# Patient Record
Sex: Female | Born: 1945 | Race: White | Hispanic: No | Marital: Married | Smoking: Former smoker
Health system: Southern US, Community
[De-identification: ages and names within clinical notes are randomized; demographics above are authoritative.]

## PROBLEM LIST (undated history)

## (undated) DIAGNOSIS — K219 Gastro-esophageal reflux disease without esophagitis: Secondary | ICD-10-CM

## (undated) DIAGNOSIS — J45909 Unspecified asthma, uncomplicated: Secondary | ICD-10-CM

## (undated) DIAGNOSIS — R110 Nausea: Secondary | ICD-10-CM

## (undated) DIAGNOSIS — IMO0002 Reserved for concepts with insufficient information to code with codable children: Secondary | ICD-10-CM

## (undated) DIAGNOSIS — C4490 Unspecified malignant neoplasm of skin, unspecified: Secondary | ICD-10-CM

## (undated) HISTORY — DX: Unspecified asthma, uncomplicated: J45.909

## (undated) HISTORY — DX: Unspecified malignant neoplasm of skin, unspecified: C44.90

## (undated) HISTORY — PX: BACK SURGERY: SHX140

## (undated) HISTORY — PX: ABDOMINAL HYSTERECTOMY: SHX81

## (undated) HISTORY — DX: Gastro-esophageal reflux disease without esophagitis: K21.9

## (undated) HISTORY — DX: Reserved for concepts with insufficient information to code with codable children: IMO0002

## (undated) HISTORY — PX: OTHER SURGICAL HISTORY: SHX169

---

## 1998-05-29 ENCOUNTER — Other Ambulatory Visit: Admission: RE | Admit: 1998-05-29 | Discharge: 1998-05-29 | Payer: Self-pay | Admitting: *Deleted

## 2001-03-23 ENCOUNTER — Encounter (INDEPENDENT_AMBULATORY_CARE_PROVIDER_SITE_OTHER): Payer: Self-pay

## 2001-03-23 ENCOUNTER — Other Ambulatory Visit: Admission: RE | Admit: 2001-03-23 | Discharge: 2001-03-23 | Payer: Self-pay | Admitting: *Deleted

## 2001-08-29 ENCOUNTER — Other Ambulatory Visit: Admission: RE | Admit: 2001-08-29 | Discharge: 2001-08-29 | Payer: Self-pay | Admitting: *Deleted

## 2010-04-11 ENCOUNTER — Encounter: Admission: RE | Admit: 2010-04-11 | Discharge: 2010-04-11 | Payer: Self-pay | Admitting: Radiology

## 2010-05-13 ENCOUNTER — Encounter: Admission: RE | Admit: 2010-05-13 | Discharge: 2010-05-13 | Payer: Self-pay | Admitting: Radiology

## 2010-07-03 ENCOUNTER — Encounter: Admission: RE | Admit: 2010-07-03 | Discharge: 2010-07-03 | Payer: Self-pay | Admitting: Radiology

## 2012-02-19 ENCOUNTER — Other Ambulatory Visit: Payer: Self-pay | Admitting: *Deleted

## 2012-02-19 DIAGNOSIS — R928 Other abnormal and inconclusive findings on diagnostic imaging of breast: Secondary | ICD-10-CM

## 2012-02-19 DIAGNOSIS — Z803 Family history of malignant neoplasm of breast: Secondary | ICD-10-CM

## 2012-02-19 DIAGNOSIS — N6019 Diffuse cystic mastopathy of unspecified breast: Secondary | ICD-10-CM

## 2012-02-19 DIAGNOSIS — R922 Inconclusive mammogram: Secondary | ICD-10-CM

## 2012-02-19 DIAGNOSIS — N6009 Solitary cyst of unspecified breast: Secondary | ICD-10-CM

## 2012-02-26 ENCOUNTER — Ambulatory Visit
Admission: RE | Admit: 2012-02-26 | Discharge: 2012-02-26 | Disposition: A | Discharge status: Home / Self Care | Payer: Commercial Managed Care - PPO | Source: Ambulatory Visit | Attending: *Deleted | Admitting: *Deleted

## 2012-02-26 DIAGNOSIS — Z803 Family history of malignant neoplasm of breast: Secondary | ICD-10-CM

## 2012-02-26 DIAGNOSIS — R922 Inconclusive mammogram: Secondary | ICD-10-CM

## 2012-02-26 DIAGNOSIS — R928 Other abnormal and inconclusive findings on diagnostic imaging of breast: Secondary | ICD-10-CM

## 2012-02-26 DIAGNOSIS — N6019 Diffuse cystic mastopathy of unspecified breast: Secondary | ICD-10-CM

## 2012-02-26 DIAGNOSIS — N6009 Solitary cyst of unspecified breast: Secondary | ICD-10-CM

## 2012-03-09 ENCOUNTER — Ambulatory Visit
Admission: RE | Admit: 2012-03-09 | Discharge: 2012-03-09 | Disposition: A | Discharge status: Home / Self Care | Payer: Commercial Managed Care - PPO | Source: Ambulatory Visit | Attending: *Deleted | Admitting: *Deleted

## 2012-03-09 DIAGNOSIS — Z803 Family history of malignant neoplasm of breast: Secondary | ICD-10-CM

## 2012-03-09 DIAGNOSIS — N6009 Solitary cyst of unspecified breast: Secondary | ICD-10-CM

## 2012-03-09 DIAGNOSIS — N6019 Diffuse cystic mastopathy of unspecified breast: Secondary | ICD-10-CM

## 2012-03-09 MED ORDER — GADOBENATE DIMEGLUMINE 529 MG/ML IV SOLN
10.0000 mL | Freq: Once | INTRAVENOUS | Status: AC | PRN
  Administered 2012-03-09: 10 mL via INTRAVENOUS

## 2012-04-18 ENCOUNTER — Other Ambulatory Visit: Payer: Self-pay | Admitting: *Deleted

## 2012-04-18 DIAGNOSIS — R918 Other nonspecific abnormal finding of lung field: Secondary | ICD-10-CM

## 2012-04-21 ENCOUNTER — Other Ambulatory Visit: Payer: Commercial Managed Care - PPO

## 2012-04-28 ENCOUNTER — Ambulatory Visit
Admission: RE | Admit: 2012-04-28 | Discharge: 2012-04-28 | Disposition: A | Discharge status: Home / Self Care | Payer: Commercial Managed Care - PPO | Source: Ambulatory Visit | Attending: *Deleted | Admitting: *Deleted

## 2012-04-28 DIAGNOSIS — R918 Other nonspecific abnormal finding of lung field: Secondary | ICD-10-CM

## 2012-09-07 ENCOUNTER — Other Ambulatory Visit: Payer: Self-pay | Admitting: Internal Medicine

## 2012-09-07 DIAGNOSIS — R911 Solitary pulmonary nodule: Secondary | ICD-10-CM

## 2012-09-09 ENCOUNTER — Other Ambulatory Visit: Payer: Commercial Managed Care - PPO

## 2012-09-23 ENCOUNTER — Ambulatory Visit
Admission: RE | Admit: 2012-09-23 | Discharge: 2012-09-23 | Disposition: A | Discharge status: Home / Self Care | Payer: Commercial Managed Care - PPO | Source: Ambulatory Visit | Attending: Internal Medicine | Admitting: Internal Medicine

## 2012-09-23 DIAGNOSIS — R911 Solitary pulmonary nodule: Secondary | ICD-10-CM

## 2014-01-30 ENCOUNTER — Encounter: Payer: Self-pay | Admitting: Gastroenterology

## 2014-02-08 ENCOUNTER — Ambulatory Visit (INDEPENDENT_AMBULATORY_CARE_PROVIDER_SITE_OTHER): Payer: Commercial Managed Care - PPO | Admitting: Otolaryngology

## 2014-03-21 ENCOUNTER — Encounter: Payer: Self-pay | Admitting: Gastroenterology

## 2014-03-21 ENCOUNTER — Other Ambulatory Visit (INDEPENDENT_AMBULATORY_CARE_PROVIDER_SITE_OTHER): Payer: Commercial Managed Care - PPO

## 2014-03-21 ENCOUNTER — Telehealth: Payer: Self-pay | Admitting: Gastroenterology

## 2014-03-21 ENCOUNTER — Ambulatory Visit (INDEPENDENT_AMBULATORY_CARE_PROVIDER_SITE_OTHER): Payer: Commercial Managed Care - PPO | Admitting: Gastroenterology

## 2014-03-21 VITALS — BP 110/60 | HR 74 | Ht 66.0 in | Wt 126.0 lb

## 2014-03-21 DIAGNOSIS — R1013 Epigastric pain: Secondary | ICD-10-CM

## 2014-03-21 LAB — CBC WITH DIFFERENTIAL/PLATELET
BASOS PCT: 1.2 % (ref 0.0–3.0)
Basophils Absolute: 0.1 10*3/uL (ref 0.0–0.1)
EOS PCT: 3.6 % (ref 0.0–5.0)
Eosinophils Absolute: 0.3 10*3/uL (ref 0.0–0.7)
HEMATOCRIT: 37.9 % (ref 36.0–46.0)
HEMOGLOBIN: 12.8 g/dL (ref 12.0–15.0)
LYMPHS PCT: 16.4 % (ref 12.0–46.0)
Lymphs Abs: 1.3 10*3/uL (ref 0.7–4.0)
MCHC: 33.9 g/dL (ref 30.0–36.0)
MCV: 88.3 fl (ref 78.0–100.0)
MONO ABS: 0.9 10*3/uL (ref 0.1–1.0)
Monocytes Relative: 10.6 % (ref 3.0–12.0)
NEUTROS ABS: 5.5 10*3/uL (ref 1.4–7.7)
Neutrophils Relative %: 68.2 % (ref 43.0–77.0)
PLATELETS: 266 10*3/uL (ref 150.0–400.0)
RBC: 4.29 Mil/uL (ref 3.87–5.11)
RDW: 12.9 % (ref 11.5–15.5)
WBC: 8.1 10*3/uL (ref 4.0–10.5)

## 2014-03-21 LAB — COMPREHENSIVE METABOLIC PANEL
ALBUMIN: 4.1 g/dL (ref 3.5–5.2)
ALK PHOS: 416 U/L — AB (ref 39–117)
ALT: 253 U/L — ABNORMAL HIGH (ref 0–35)
AST: 187 U/L — ABNORMAL HIGH (ref 0–37)
BUN: 9 mg/dL (ref 6–23)
CALCIUM: 9.6 mg/dL (ref 8.4–10.5)
CO2: 27 meq/L (ref 19–32)
Chloride: 102 mEq/L (ref 96–112)
Creatinine, Ser: 0.6 mg/dL (ref 0.4–1.2)
GFR: 97.51 mL/min (ref >60.00)
Glucose, Bld: 95 mg/dL (ref 70–99)
Potassium: 3.8 mEq/L (ref 3.5–5.1)
Sodium: 137 mEq/L (ref 135–145)
TOTAL PROTEIN: 7 g/dL (ref 6.0–8.3)
Total Bilirubin: 4.7 mg/dL — ABNORMAL HIGH (ref 0.2–1.2)

## 2014-03-21 NOTE — Patient Instructions (Addendum)
You will be set up for a CT scan of abdomen and pelvis with IV and oral contrast (for epigastric pain)  You have been scheduled for a CT scan of the abdomen and pelvis at [facility] ([address]---this is in the same building as Press [facility]).   You are scheduled on 03/22/14 at 2 PM. You should arrive 15 minutes prior to your appointment time for registration. Please follow the written instructions below on the day of your exam:  WARNING: IF YOU ARE ALLERGIC TO IODINE/X-RAY DYE, PLEASE NOTIFY RADIOLOGY IMMEDIATELY AT [phone]! YOU WILL BE GIVEN A 13 HOUR PREMEDICATION PREP.  1) Do not eat or drink anything after 10 AM (4 hours prior to your test) 2) You have been given 2 bottles of oral contrast to drink. The solution may taste  better if refrigerated, but do NOT add ice or any other liquid to this solution. Shake well before drinking.    Drink 1 bottle of contrast @ 12 PM (2 hours prior to your exam)  Drink 1 bottle of contrast @ 1 PM  (1 hour prior to your exam)  You may take any medications as prescribed with a small amount of water except for the following: Metformin, Glucophage, Glucovance, Avandamet, Riomet, Fortamet, Actoplus Met, Janumet, Glumetza or Metaglip. The above medications must be held the day of the exam AND 48 hours after the exam.  The purpose of you drinking the oral contrast is to aid in the visualization of your intestinal tract. The contrast solution may cause some diarrhea. Before your exam is started, you will be given a small amount of fluid to drink. Depending on your individual set of symptoms, you may also receive an intravenous injection of x-ray contrast/dye. Plan on being at [facility] for 30 minutes or long, depending on the type of exam you are having performed.  This test typically takes 30-45 minutes to complete.  If you have any questions regarding your exam or if you need to reschedule, you may call the CT department at  [phone] between the hours of 8:00 am and 5:00 pm, Monday-Friday.  ________________________________________________________________________  Dennis Bast will have labs checked today in the basement lab.  Please head down after you check out with the front desk  ( cbc, cmet) Pending these results, you may need EGD (would do screening colonoscopy at the same time). Start one gas ex prior to every meal.

## 2014-03-21 NOTE — Progress Notes (Signed)
HPI: This is a   very pleasant 68 year old woman whom I am meeting for the first time today.  Labs 12/2013: cmet normal, cbc normal  Colonoscopy 10/2002 no polyps  Had an incident this past march. Intense substernal pain, jaw pain.  Went to her doctor immediately, then to ER.  Cardiology testing afterwards (echo, stress test, EKG all pointed against cardiac problem.  Took a BP med and ASA for about a month and then stopped.  After that changed her diet, became more mindful of stress. 2-3 alleve per week.  Had "ulcer" in college.  Since then, intermittent nausea, gasseousness.  Pains bilaterally.  Followed by indigestion.  Started prilosec; one pill twice daily. Seems to help.  Pain has been pretty consistent, constant.  Eating sometimes improves it.   BMs improve it a bit.    Has lost 3-4 pounds.  Review of systems: Pertinent positive and negative review of systems were noted in the above HPI section. Complete review of systems was performed and was otherwise normal.    Past Medical History  Diagnosis Date  . GERD (gastroesophageal reflux disease)   . Asthma   . Ulcer     in college  . Skin cancer     Past Surgical History  Procedure Laterality Date  . Abdominal hysterectomy      Current Outpatient Prescriptions  Medication Sig Dispense Refill  . Cholecalciferol (VITAMIN D-3 PO) Take by mouth as directed.      . ezetimibe-simvastatin (VYTORIN) 10-10 MG per tablet Take 1 tablet by mouth daily.      . montelukast (SINGULAIR) 10 MG tablet Take 10 mg by mouth daily. During allergy season      . omeprazole (PRILOSEC) 20 MG capsule Take 20 mg by mouth 2 (two) times daily.       No current facility-administered medications for this visit.    Allergies as of 03/21/2014  . (No Known Allergies)    Family History  Problem Relation Age of Onset  . Colon cancer Neg Hx   . Stomach cancer Neg Hx   . Breast cancer Mother     History   Social History  . Marital Status:  Married    Spouse Name: N/A    Number of Children: 2  . Years of Education: N/A   Occupational History  . Not on file.   Social History Main Topics  . Smoking status: Former Research scientist (life sciences)  . Smokeless tobacco: Never Used  . Alcohol Use: Yes  . Drug Use: No  . Sexual Activity: Not on file   Other Topics Concern  . Not on file   Social History Narrative   Coffee daily        Physical Exam: BP 110/60  Pulse 74  Ht 5\' 6"  (1.676 m)  Wt 126 lb (57.153 kg)  BMI 20.35 kg/m2 Constitutional: generally well-appearing Psychiatric: alert and oriented x3 Eyes: extraocular movements intact Mouth: oral pharynx moist, no lesions Neck: supple no lymphadenopathy Cardiovascular: heart regular rate and rhythm Lungs: clear to auscultation bilaterally Abdomen: soft, very mildly tender in the mid epigastrium, nondistended, no obvious ascites, no peritoneal signs, normal bowel sounds Extremities: no lower extremity edema bilaterally Skin: no lesions on visible extremities    Assessment and plan: 68 y.o. female with  several months of abdominal discomfort, gassiness, mild weight loss  She is a bit tender on examination today and with her weight loss I would like to proceed first with imaging to rule out as best we  can significant neoplastic causes. She will have a repeat set of labs including CBC and complete metabolic profile as well. If these tests are helpful then she will need EGD at the same time I would proceed with screening colonoscopy since she has not had colon cancer screening for over 10 years now. In the meantime she will take a single Gas-X pill prior to every meal.

## 2014-03-22 ENCOUNTER — Ambulatory Visit (INDEPENDENT_AMBULATORY_CARE_PROVIDER_SITE_OTHER)
Admission: RE | Admit: 2014-03-22 | Discharge: 2014-03-22 | Disposition: A | Discharge status: Home / Self Care | Payer: Commercial Managed Care - PPO | Source: Ambulatory Visit | Attending: Gastroenterology | Admitting: Gastroenterology

## 2014-03-22 ENCOUNTER — Telehealth: Payer: Self-pay | Admitting: Gastroenterology

## 2014-03-22 DIAGNOSIS — K8689 Other specified diseases of pancreas: Secondary | ICD-10-CM

## 2014-03-22 DIAGNOSIS — R1013 Epigastric pain: Secondary | ICD-10-CM

## 2014-03-22 MED ORDER — IOHEXOL 300 MG/ML  SOLN
80.0000 mL | Freq: Once | INTRAMUSCULAR | Status: AC | PRN
  Administered 2014-03-22: 80 mL via INTRAVENOUS

## 2014-03-22 NOTE — Telephone Encounter (Signed)
I closed the note by accident the pt just wanted you to be aware of the clay colored stools

## 2014-03-22 NOTE — Telephone Encounter (Signed)
Dr Jac Canavan pt wanted you to know about the clay colored stools

## 2014-03-22 NOTE — Telephone Encounter (Signed)
Ok, needs zofran 4mg  pills, take one pill twice daily as needed for nausea, disp 60 with 3 refills.

## 2014-03-22 NOTE — Telephone Encounter (Signed)
Can the patient have something for nausea sent in?

## 2014-03-23 ENCOUNTER — Other Ambulatory Visit: Payer: Commercial Managed Care - PPO

## 2014-03-23 ENCOUNTER — Other Ambulatory Visit: Payer: Self-pay

## 2014-03-23 ENCOUNTER — Encounter (HOSPITAL_COMMUNITY): Payer: Self-pay | Admitting: Pharmacy Technician

## 2014-03-23 ENCOUNTER — Encounter (HOSPITAL_COMMUNITY): Payer: Self-pay | Admitting: *Deleted

## 2014-03-23 DIAGNOSIS — K8689 Other specified diseases of pancreas: Secondary | ICD-10-CM

## 2014-03-23 MED ORDER — ONDANSETRON HCL 4 MG PO TABS: 4.0000 mg | ORAL_TABLET | Freq: Two times a day (BID) | ORAL | Status: DC

## 2014-03-23 NOTE — Telephone Encounter (Signed)
EUS scheduled, pt instructed and medications reviewed.  Patient instructions mailed to home.  Patient to call with any questions or concerns.  Zofran sent in   Referral to Dr Benay Spice made

## 2014-03-24 LAB — CANCER ANTIGEN 19-9: CA 19-9: 364.1 U/mL — ABNORMAL HIGH (ref <35.0)

## 2014-03-26 ENCOUNTER — Telehealth: Payer: Self-pay | Admitting: Gastroenterology

## 2014-03-26 NOTE — Telephone Encounter (Signed)
Left message for pt to call back.  Discussed with pt that the referral is in epic and that I had called and left a message on the new pt coordinators voicemail. Pt informed that the cancer center will contact her with the appt. Pt verbalized understanding.

## 2014-03-28 ENCOUNTER — Telehealth: Payer: Self-pay | Admitting: *Deleted

## 2014-03-28 NOTE — Telephone Encounter (Signed)
Left voice message for patient to please return call for appointment.  Contact name and phone number was provided.

## 2014-03-29 ENCOUNTER — Encounter (HOSPITAL_COMMUNITY): Payer: Self-pay | Admitting: Certified Registered"

## 2014-03-29 ENCOUNTER — Encounter (HOSPITAL_COMMUNITY): Payer: Commercial Managed Care - PPO | Admitting: Certified Registered"

## 2014-03-29 ENCOUNTER — Ambulatory Visit (HOSPITAL_COMMUNITY): Payer: Commercial Managed Care - PPO | Admitting: Certified Registered"

## 2014-03-29 ENCOUNTER — Encounter (HOSPITAL_COMMUNITY)
Admission: RE | Disposition: A | Discharge status: Home / Self Care | Payer: Commercial Managed Care - PPO | Source: Ambulatory Visit | Attending: Gastroenterology

## 2014-03-29 ENCOUNTER — Ambulatory Visit (HOSPITAL_COMMUNITY)
Admission: RE | Admit: 2014-03-29 | Discharge: 2014-03-29 | Disposition: A | Discharge status: Home / Self Care | Payer: Commercial Managed Care - PPO | Source: Ambulatory Visit | Attending: Gastroenterology | Admitting: Gastroenterology

## 2014-03-29 ENCOUNTER — Ambulatory Visit (HOSPITAL_COMMUNITY): Payer: Commercial Managed Care - PPO

## 2014-03-29 DIAGNOSIS — R110 Nausea: Secondary | ICD-10-CM | POA: Insufficient documentation

## 2014-03-29 DIAGNOSIS — Z79899 Other long term (current) drug therapy: Secondary | ICD-10-CM | POA: Insufficient documentation

## 2014-03-29 DIAGNOSIS — K7689 Other specified diseases of liver: Secondary | ICD-10-CM | POA: Insufficient documentation

## 2014-03-29 DIAGNOSIS — K8689 Other specified diseases of pancreas: Secondary | ICD-10-CM

## 2014-03-29 DIAGNOSIS — K869 Disease of pancreas, unspecified: Secondary | ICD-10-CM | POA: Insufficient documentation

## 2014-03-29 DIAGNOSIS — R17 Unspecified jaundice: Secondary | ICD-10-CM

## 2014-03-29 DIAGNOSIS — K831 Obstruction of bile duct: Secondary | ICD-10-CM

## 2014-03-29 DIAGNOSIS — Z85828 Personal history of other malignant neoplasm of skin: Secondary | ICD-10-CM | POA: Insufficient documentation

## 2014-03-29 DIAGNOSIS — J45909 Unspecified asthma, uncomplicated: Secondary | ICD-10-CM | POA: Insufficient documentation

## 2014-03-29 DIAGNOSIS — Z87891 Personal history of nicotine dependence: Secondary | ICD-10-CM | POA: Insufficient documentation

## 2014-03-29 DIAGNOSIS — K219 Gastro-esophageal reflux disease without esophagitis: Secondary | ICD-10-CM | POA: Insufficient documentation

## 2014-03-29 DIAGNOSIS — C259 Malignant neoplasm of pancreas, unspecified: Secondary | ICD-10-CM

## 2014-03-29 DIAGNOSIS — C250 Malignant neoplasm of head of pancreas: Secondary | ICD-10-CM | POA: Insufficient documentation

## 2014-03-29 HISTORY — PX: ENDOSCOPIC RETROGRADE CHOLANGIOPANCREATOGRAPHY (ERCP) WITH PROPOFOL: SHX5810

## 2014-03-29 HISTORY — PX: EUS: SHX5427

## 2014-03-29 HISTORY — DX: Nausea: R11.0

## 2014-03-29 LAB — HEPATIC FUNCTION PANEL
ALBUMIN: 3.6 g/dL (ref 3.5–5.2)
ALK PHOS: 693 U/L — AB (ref 39–117)
ALT: 329 U/L — ABNORMAL HIGH (ref 0–35)
AST: 228 U/L — ABNORMAL HIGH (ref 0–37)
BILIRUBIN TOTAL: 7.3 mg/dL — AB (ref 0.3–1.2)
Bilirubin, Direct: 5.4 mg/dL — ABNORMAL HIGH (ref 0.0–0.3)
Indirect Bilirubin: 1.9 mg/dL — ABNORMAL HIGH (ref 0.3–0.9)
TOTAL PROTEIN: 6.5 g/dL (ref 6.0–8.3)

## 2014-03-29 SURGERY — UPPER ENDOSCOPIC ULTRASOUND (EUS) LINEAR
Anesthesia: Monitor Anesthesia Care

## 2014-03-29 MED ORDER — PROPOFOL 10 MG/ML IV BOLUS
INTRAVENOUS | Status: DC | PRN
  Administered 2014-03-29 (×3): 50 mg via INTRAVENOUS

## 2014-03-29 MED ORDER — MIDAZOLAM HCL 2 MG/2ML IJ SOLN
INTRAMUSCULAR | Status: AC
  Filled 2014-03-29: qty 2

## 2014-03-29 MED ORDER — SODIUM CHLORIDE 0.9 % IV SOLN
INTRAVENOUS | Status: DC | PRN
  Administered 2014-03-29: 10:00:00

## 2014-03-29 MED ORDER — PROPOFOL 10 MG/ML IV BOLUS
INTRAVENOUS | Status: AC
  Filled 2014-03-29: qty 20

## 2014-03-29 MED ORDER — FENTANYL CITRATE 0.05 MG/ML IJ SOLN
INTRAMUSCULAR | Status: AC
  Filled 2014-03-29: qty 2

## 2014-03-29 MED ORDER — LIDOCAINE HCL (CARDIAC) 20 MG/ML IV SOLN
INTRAVENOUS | Status: AC
Start: 2014-03-29 — End: 2014-03-29
  Filled 2014-03-29: qty 5

## 2014-03-29 MED ORDER — ONDANSETRON HCL 4 MG/2ML IJ SOLN
INTRAMUSCULAR | Status: AC
  Filled 2014-03-29: qty 2

## 2014-03-29 MED ORDER — PROPOFOL 10 MG/ML IV BOLUS
INTRAVENOUS | Status: AC
Start: 2014-03-29 — End: 2014-03-29
  Filled 2014-03-29: qty 20

## 2014-03-29 MED ORDER — LACTATED RINGERS IV SOLN
INTRAVENOUS | Status: DC | PRN
  Administered 2014-03-29: 08:00:00 via INTRAVENOUS

## 2014-03-29 MED ORDER — FENTANYL CITRATE 0.05 MG/ML IJ SOLN
INTRAMUSCULAR | Status: DC | PRN
  Administered 2014-03-29 (×2): 50 ug via INTRAVENOUS

## 2014-03-29 MED ORDER — HYDROCODONE-ACETAMINOPHEN 5-325 MG PO TABS: 1.0000 | ORAL_TABLET | Freq: Four times a day (QID) | ORAL | Status: DC | PRN

## 2014-03-29 MED ORDER — LACTATED RINGERS IV SOLN: INTRAVENOUS | Status: DC

## 2014-03-29 MED ORDER — SODIUM CHLORIDE 0.9 % IV SOLN: INTRAVENOUS | Status: DC

## 2014-03-29 MED ORDER — PROPOFOL INFUSION 10 MG/ML OPTIME
INTRAVENOUS | Status: DC | PRN
  Administered 2014-03-29: 140 ug/kg/min via INTRAVENOUS

## 2014-03-29 MED ORDER — LIDOCAINE HCL (PF) 2 % IJ SOLN
INTRAMUSCULAR | Status: DC | PRN
  Administered 2014-03-29: 20 mg via INTRADERMAL

## 2014-03-29 MED ORDER — ONDANSETRON HCL 4 MG/2ML IJ SOLN
INTRAMUSCULAR | Status: DC | PRN
  Administered 2014-03-29: 4 mg via INTRAVENOUS

## 2014-03-29 NOTE — H&P (View-Only) (Signed)
HPI: This is a   very pleasant 68 year old woman whom I am meeting for the first time today.  Labs 12/2013: cmet normal, cbc normal  Colonoscopy 10/2002 no polyps  Had an incident this past march. Intense substernal pain, jaw pain.  Went to her doctor immediately, then to ER.  Cardiology testing afterwards (echo, stress test, EKG all pointed against cardiac problem.  Took a BP med and ASA for about a month and then stopped.  After that changed her diet, became more mindful of stress. 2-3 alleve per week.  Had "ulcer" in college.  Since then, intermittent nausea, gasseousness.  Pains bilaterally.  Followed by indigestion.  Started prilosec; one pill twice daily. Seems to help.  Pain has been pretty consistent, constant.  Eating sometimes improves it.   BMs improve it a bit.    Has lost 3-4 pounds.  Review of systems: Pertinent positive and negative review of systems were noted in the above HPI section. Complete review of systems was performed and was otherwise normal.    Past Medical History  Diagnosis Date  . GERD (gastroesophageal reflux disease)   . Asthma   . Ulcer     in college  . Skin cancer     Past Surgical History  Procedure Laterality Date  . Abdominal hysterectomy      Current Outpatient Prescriptions  Medication Sig Dispense Refill  . Cholecalciferol (VITAMIN D-3 PO) Take by mouth as directed.      . ezetimibe-simvastatin (VYTORIN) 10-10 MG per tablet Take 1 tablet by mouth daily.      . montelukast (SINGULAIR) 10 MG tablet Take 10 mg by mouth daily. During allergy season      . omeprazole (PRILOSEC) 20 MG capsule Take 20 mg by mouth 2 (two) times daily.       No current facility-administered medications for this visit.    Allergies as of 03/21/2014  . (No Known Allergies)    Family History  Problem Relation Age of Onset  . Colon cancer Neg Hx   . Stomach cancer Neg Hx   . Breast cancer Mother     History   Social History  . Marital Status:  Married    Spouse Name: N/A    Number of Children: 2  . Years of Education: N/A   Occupational History  . Not on file.   Social History Main Topics  . Smoking status: Former Research scientist (life sciences)  . Smokeless tobacco: Never Used  . Alcohol Use: Yes  . Drug Use: No  . Sexual Activity: Not on file   Other Topics Concern  . Not on file   Social History Narrative   Coffee daily        Physical Exam: BP 110/60  Pulse 74  Ht 5\' 6"  (1.676 m)  Wt 126 lb (57.153 kg)  BMI 20.35 kg/m2 Constitutional: generally well-appearing Psychiatric: alert and oriented x3 Eyes: extraocular movements intact Mouth: oral pharynx moist, no lesions Neck: supple no lymphadenopathy Cardiovascular: heart regular rate and rhythm Lungs: clear to auscultation bilaterally Abdomen: soft, very mildly tender in the mid epigastrium, nondistended, no obvious ascites, no peritoneal signs, normal bowel sounds Extremities: no lower extremity edema bilaterally Skin: no lesions on visible extremities    Assessment and plan: 68 y.o. female with  several months of abdominal discomfort, gassiness, mild weight loss  She is a bit tender on examination today and with her weight loss I would like to proceed first with imaging to rule out as best we  can significant neoplastic causes. She will have a repeat set of labs including CBC and complete metabolic profile as well. If these tests are helpful then she will need EGD at the same time I would proceed with screening colonoscopy since she has not had colon cancer screening for over 10 years now. In the meantime she will take a single Gas-X pill prior to every meal.

## 2014-03-29 NOTE — Anesthesia Preprocedure Evaluation (Signed)
Anesthesia Evaluation  Patient identified by MRN, date of birth, ID band Patient awake    Reviewed: Allergy & Precautions, H&P , NPO status , Patient's Chart, lab work & pertinent test results  Airway Mallampati: II TM Distance: >3 FB Neck ROM: Full    Dental  (+) Dental Advisory Given   Pulmonary asthma , former smoker,  breath sounds clear to auscultation        Cardiovascular negative cardio ROS  Rhythm:Regular Rate:Normal     Neuro/Psych negative neurological ROS  negative psych ROS   GI/Hepatic Neg liver ROS, GERD-  Medicated,  Endo/Other  negative endocrine ROS  Renal/GU negative Renal ROS     Musculoskeletal negative musculoskeletal ROS (+)   Abdominal   Peds  Hematology negative hematology ROS (+)   Anesthesia Other Findings   Reproductive/Obstetrics negative OB ROS                           Anesthesia Physical Anesthesia Plan  ASA: II  Anesthesia Plan: MAC   Post-op Pain Management:    Induction:   Airway Management Planned:   Additional Equipment:   Intra-op Plan:   Post-operative Plan:   Informed Consent: I have reviewed the patients History and Physical, chart, labs and discussed the procedure including the risks, benefits and alternatives for the proposed anesthesia with the patient or authorized representative who has indicated his/her understanding and acceptance.   Dental advisory given  Plan Discussed with: CRNA  Anesthesia Plan Comments:         Anesthesia Quick Evaluation

## 2014-03-29 NOTE — Op Note (Signed)
[facility] [place], [address]   ERCP PROCEDURE REPORT  PATIENT: Ashley Perry, Ashley Perry.  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 MEDICATIONS: MAC sedation, administered by CRNA TOPICAL ANESTHETIC: none  DESCRIPTION OF PROCEDURE:   After the risks benefits and alternatives of the procedure were thoroughly explained, informed consent was obtained.  The Pentax Ercp Scope [id number]  endoscope was introduced through the mouth  and advanced to the second portion of the duodenum  without detailed examination of the UGI tract. The major papilla was normal. A 44 Autotome over a .035 hydrawire was used to cannulate the bile duct and contrast was injected. Cholangiogram revealed a 1cm long mid CBD stricture. The intra and extrahepatic duct proximal to the stricture was dilated (CBD 8mm). There was 1.7cm of normal appearing (non-dilated) CBD distal to the stricture. The cystic duct did not fill.  A 6cm long 53mm diameter uncovered metal stent was placed in good position bridging the mid CBD stricture and extending into the duodenum by 1cm.  There was immediate delivery of very dark bile into the duodenum following stent placement.  The main pancreatic duct was never injected or cannulated with wire.  The scope was then completely withdrawn from the patient and the procedure terminated.    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 This was stented with a 6cm long 70mm diameter uncovered metal stent.  RECOMMENDATIONS: Follow clinically. She is already scheduled to meet Dr.  Julieanne Manson next Tuesday.  _______________________________ eSigned:  Milus Banister, MD 03/29/2014 10:16 AM   CC: Crist Infante, MD

## 2014-03-29 NOTE — Op Note (Signed)
[facility] [place], [address]   ENDOSCOPIC ULTRASOUND PROCEDURE REPORT  PATIENT: Ashley Perry, Ashley Perry  MR#: [id number] BIRTHDATE: March 24, 1946  GENDER: Female ENDOSCOPIST: Milus Banister, MD PROCEDURE DATE:  03/29/2014 PROCEDURE:   Upper EUS w/FNA ASA CLASS:      Class II INDICATIONS:   1.  mass in head of pancreas, suspicious lesion in liver, jaundice, weight loss. MEDICATIONS: MAC sedation, administered by CRNA  DESCRIPTION OF PROCEDURE:   After the risks benefits and alternatives of the procedure were  explained, informed consent was obtained. The patient was then placed in the left, lateral, decubitus postion and IV sedation was administered. Throughout the procedure, the patients blood pressure, pulse and oxygen saturations were monitored continuously.  Under direct visualization, the EUS scope 0384  endoscope was introduced through the mouth  and advanced to the second portion of the duodenum . Water was used as necessary to provide an acoustic interface.  Upon completion of the imaging, water was removed and the patient was sent to the recovery room in satisfactory condition.  Endoscopic findings: 1. Normal UGI tract  EUS findings (with radial and linear echoendoscopes): 1. 4.1cm by 2.6cm hypoechoic, heterogeneous mass in head of pancreas with very irregular outer margins. The mass directly abuts the portal vein along 1.5cm.  The mass was sampled with 3 transduodenal passes with a 25 guage EUS FNA needle, suction.  The pancreas was otherwise normal.  Main pancreatic duct was non-dilated. 2. The mass is causing biliary obstruction, CBD 1.1cm maximally. 3. The gallbladder was filled with sludge. 4. 2-3 subcentimeter peripancreatic lymphnodes that were concerning for melignant involvement. 5. Limited views of left lobe of liver, spleen, splenic vessels were all normal.  Impression: 4.1cm by 2.6cm mass in head of pancreas that involves the  main portal vein along 1.5cm. There were 2-3 small, but suspicious peripancreatic lymphnodes. Preliminary cytology review was positive for malignancy (adenocarcinoma). Given the new enhancing liver lesion, this is best staged T3N1M1 (Stage IV).  Will proceed with ERCP now.  She already has appointment scheduled with Dr. Learta Codding Tuesday 7/7 to arrive at 1:15pm.  _______________________________ eSigned:  Milus Banister, MD 03/29/2014 9:42 AM   cc: Crist Infante, MD

## 2014-03-29 NOTE — Anesthesia Postprocedure Evaluation (Signed)
Anesthesia Post Note  Patient: Ashley Perry  Procedure(s) Performed: Procedure(s) (LRB): UPPER ENDOSCOPIC ULTRASOUND (EUS) LINEAR (N/A) ENDOSCOPIC RETROGRADE CHOLANGIOPANCREATOGRAPHY (ERCP) WITH PROPOFOL (N/A)  Anesthesia type: MAC  Patient location: PACU  Post pain: Pain level controlled  Post assessment: Post-op Vital signs reviewed  Last Vitals: BP 152/88  Pulse 61  Temp(Src) 36.4 C (Oral)  Resp 17  Ht 5\' 6"  (1.676 m)  Wt 126 lb (57.153 kg)  BMI 20.35 kg/m2  SpO2 98%  Post vital signs: Reviewed  Level of consciousness: awake  Complications: No apparent anesthesia complications

## 2014-03-29 NOTE — Interval H&P Note (Signed)
History and Physical Interval Note:  03/29/2014 8:06 AM  Ashley Perry  has presented today for surgery, with the diagnosis of Pancreatic mass [577.9]  The various methods of treatment have been discussed with the patient and family. After consideration of risks, benefits and other options for treatment, the patient has consented to  Procedure(s): UPPER ENDOSCOPIC ULTRASOUND (EUS) LINEAR (N/A) ENDOSCOPIC RETROGRADE CHOLANGIOPANCREATOGRAPHY (ERCP) WITH PROPOFOL (N/A) as a surgical intervention .  The patient's history has been reviewed, patient examined, no change in status, stable for surgery.  I have reviewed the patient's chart and labs.  Questions were answered to the patient's satisfaction.     Milus Banister

## 2014-03-29 NOTE — Transfer of Care (Signed)
Immediate Anesthesia Transfer of Care Note  Patient: Ashley Perry  Procedure(s) Performed: Procedure(s) (LRB): UPPER ENDOSCOPIC ULTRASOUND (EUS) LINEAR (N/A) ENDOSCOPIC RETROGRADE CHOLANGIOPANCREATOGRAPHY (ERCP) WITH PROPOFOL (N/A)  Patient Location: PACU  Anesthesia Type: MAC  Level of Consciousness: sedated, patient cooperative and responds to stimulation  Airway & Oxygen Therapy: Patient Spontanous Breathing and Patient connected to face mask oxgen  Post-op Assessment: Report given to PACU RN and Post -op Vital signs reviewed and stable  Post vital signs: Reviewed and stable  Complications: No apparent anesthesia complications

## 2014-03-29 NOTE — Discharge Instructions (Signed)

## 2014-03-30 ENCOUNTER — Encounter (HOSPITAL_COMMUNITY): Payer: Self-pay | Admitting: Gastroenterology

## 2014-04-05 ENCOUNTER — Telehealth: Payer: Self-pay | Admitting: *Deleted

## 2014-04-05 NOTE — Telephone Encounter (Signed)
Spoke with patient by phone and confirmed appointment with Dr. Benay Spice for 04/10/14.  Contact names, directions,  and numbers were provided.  Patient expressed appreciation for the call.

## 2014-04-10 ENCOUNTER — Ambulatory Visit: Payer: Commercial Managed Care - PPO

## 2014-04-10 ENCOUNTER — Inpatient Hospital Stay
Admission: RE | Admit: 2014-04-10 | Discharge: 2014-04-10 | Disposition: A | Discharge status: Home / Self Care | Payer: Self-pay | Source: Ambulatory Visit | Attending: Oncology | Admitting: Oncology

## 2014-04-10 ENCOUNTER — Other Ambulatory Visit: Payer: Self-pay | Admitting: Oncology

## 2014-04-10 ENCOUNTER — Encounter: Payer: Self-pay | Admitting: Oncology

## 2014-04-10 ENCOUNTER — Ambulatory Visit (HOSPITAL_BASED_OUTPATIENT_CLINIC_OR_DEPARTMENT_OTHER): Payer: Commercial Managed Care - PPO | Admitting: Oncology

## 2014-04-10 ENCOUNTER — Telehealth: Payer: Self-pay | Admitting: Oncology

## 2014-04-10 VITALS — BP 150/77 | HR 62 | Temp 97.9°F | Resp 18 | Ht 66.0 in | Wt 125.6 lb

## 2014-04-10 DIAGNOSIS — C4490 Unspecified malignant neoplasm of skin, unspecified: Secondary | ICD-10-CM

## 2014-04-10 DIAGNOSIS — R911 Solitary pulmonary nodule: Secondary | ICD-10-CM

## 2014-04-10 DIAGNOSIS — C787 Secondary malignant neoplasm of liver and intrahepatic bile duct: Secondary | ICD-10-CM

## 2014-04-10 DIAGNOSIS — C259 Malignant neoplasm of pancreas, unspecified: Secondary | ICD-10-CM

## 2014-04-10 DIAGNOSIS — C250 Malignant neoplasm of head of pancreas: Secondary | ICD-10-CM

## 2014-04-10 NOTE — Progress Notes (Signed)
Met with Ashley Perry and family. Explained role of nurse navigator. Educational information provided on pancreatic cancer  Referral made to dietician for diet education. [facility] [place] resources provided to patient, including SW service and support group information.  Contact names and phone numbers were provided for entire [facility] team.  Teach back method was used.  No barriers to care identified at present time.  Will continue to follow as needed.

## 2014-04-10 NOTE — Progress Notes (Signed)
Checked in new pt with no financial concerns. °

## 2014-04-10 NOTE — Progress Notes (Signed)
[facility] Patient Consult   Referring MD: Cynitha Berte 68 y.o.  Feb 13, 1946    Reason for Referral: Pancreas cancer   HPI: She developed low anterior chest discomfort and jaw pain while in [place] in March of this year. She reports undergoing a negative cardiology evaluation there. She reports "indigestion" for the past several months. This progressed to nausea prior to seeing Dr. Ardis Hughs 03/21/2014. She became jaundiced.  She was referred for a CT of the abdomen 03/22/2014. There was an area of inflammation in the inferior right middle lobe. A mass was noted in the head of the pancreas adjacent to the right lateral margin of the superior mesenteric vein. The mass appeared in close proximity to the undersurface of the portal vein and was separate from the superior mesenteric artery. The gastroduodenal artery appeared associated with the anterolateral surface of the mass. Prominent peripancreatic lymph nodes were seen adjacent to the head of the pancreas measuring up to 9 mm suspicious for local nodal metastases. The mass was noted to compress the distal common bile the with proximal bowel duct dilatation. The gallbladder also appeared distended. No pancreatic ductal dilatation. Several hepatic lesions were too small to characterize. A lesion in segment 4A adjacent to the dome is new compared to a chest CT from 09/23/2012 and measured 1.4 x 1.3 cm. No ascites.  She was taken to an endoscopic ultrasound 03/29/2014. A mass was confirmed in the head of the pancreas with direct abutment of the portal vein. The mass was biopsied. The pancreas was otherwise normal. The common bile duct was dilated. 2-3 subcentimeter peripancreatic lymph nodes were concerning for malignant involvement. The lesion was staged as a T3, N1, M1 tumor based on the ultrasound and CT findings.  The cytology ([id number]) from the pancreas mass biopsy confirmed malignant cells and tumor necrosis  consistent with adenocarcinoma.   Dr. Ardis Hughs placed and uncovered metal bile duct stent on 03/29/2014. She reports improvement in the nausea and "indigestion" since the stent was placed.  Past Medical History  Diagnosis Date  . GERD (gastroesophageal reflux disease)   . Ulcer     in college  . 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 Asthma     CHILDHOOD ASTHMA  .  G2 P2      Past Surgical History  Procedure Laterality Date  . C sections      X 2  . Abdominal hysterectomy    . Back surgery  54YRS AGO    L 5 DISCECTOMY  . Eus N/A 03/29/2014    Procedure: UPPER ENDOSCOPIC ULTRASOUND (EUS) LINEAR;  Surgeon: Milus Banister, MD;  Location: [facility];  Service: Endoscopy;  Laterality: N/A;  . Endoscopic retrograde cholangiopancreatography (ercp) with propofol N/A 03/29/2014    Procedure: ENDOSCOPIC RETROGRADE CHOLANGIOPANCREATOGRAPHY (ERCP) WITH PROPOFOL;  Surgeon: Milus Banister, MD;  Location: [facility];  Service: Endoscopy;  Laterality: N/A;    Medications: Reviewed  Allergies: No Known Allergies  Family history: Her mother had breast cancer at age 5, 2 maternal aunts had breast cancer. She reports undergoing a negative BRCA testing. No other known family history of cancer. Her maternal grandmother may have died of "cancer ".  Social History:   She lives between [place] and [place]. She does not use tobacco. She reports social wine use. She received autologous blood at the time of childbirth. No risk factor for HIV or hepatitis. She is a Agricultural engineer.  History  Alcohol Use  . Yes    Comment: OCCASIONAL    History  Smoking status  . Former Smoker -- 0.25 packs/day for 1 years  . Quit date: 10/05/1961  Smokeless tobacco  . Never Used      ROS:   Positives include: 7 pound weight loss, nausea prior to placement of the bile duct stent, low anterior chest pain in March 2015, "indigestion" intermittently, jaundice prior to placement of a bile duct  stent  A complete ROS was otherwise negative.  Physical Exam:  Blood pressure 150/77, pulse 62, temperature 97.9 F (36.6 C), temperature source Oral, resp. rate 18, height 5\' 6"  (1.676 m), weight 125 lb 9.6 oz (56.972 kg).  HEENT: Oropharynx without visible mass, neck without mass Lungs: Clear bilaterally Cardiac: Regular rate and rhythm with premature beats Abdomen: No hepatomegaly, nontender, no mass, no apparent ascites  Vascular: No leg edema Lymph nodes: No cervical, supraclavicular, axillary, or inguinal nodes Neurologic: Alert and oriented, the motor exam appears intact in the upper and lower extremities Skin: No rash, slightly raised erythematous lesions at the lower legs Musculoskeletal: No spine tenderness Breast: Bilateral breast without mass   LAB:  CBC  Lab Results  Component Value Date   WBC 8.1 03/21/2014   HGB 12.8 03/21/2014   HCT 37.9 03/21/2014   MCV 88.3 03/21/2014   PLT 266.0 03/21/2014   NEUTROABS 5.5 03/21/2014     CMP      Component Value Date/Time   NA 137 03/21/2014 0958   K 3.8 03/21/2014 0958   CL 102 03/21/2014 0958   CO2 27 03/21/2014 0958   GLUCOSE 95 03/21/2014 0958   BUN 9 03/21/2014 0958   CREATININE 0.6 03/21/2014 0958   CALCIUM 9.6 03/21/2014 0958   PROT 6.5 03/29/2014 0815   ALBUMIN 3.6 03/29/2014 0815   AST 228* 03/29/2014 0815   ALT 329* 03/29/2014 0815   ALKPHOS 693* 03/29/2014 0815   BILITOT 7.3* 03/29/2014 0815   CA 19-9 on 03/23/2014-364.1   Imaging: As per history of present illness. I reviewed the 03/22/2014 CT abdomen/pelvis images with Ms. Kopf and her family   Assessment/Plan:   1. Adenocarcinoma the pancreas, clinical stage IV (T3, N1, M1), status post an EUS biopsy of a pancreas head mass 03/29/2014 confirming adenocarcinoma  CT and EUS evidence of portal vein abutment  Elevated CA 19-9  Multiple liver lesions on the CT 03/29/2014 concerning for metastases, the largest lesion is new compared to a CT from  09/23/2012   2.  obstructive jaundice secondary to #1, status post placement of a bile duct stent 03/29/2014   Disposition:   Ms. Barua has been diagnosed with pancreas cancer. I discussed the diagnosis and treatment options with Ms. Habig and her family. We reviewed the CT images. She appears to have metastatic disease based on the CT finding of a new liver lesion. She will not be a candidate for curative surgery if she is proven to have a liver metastasis.  I will present her case at the GI tumor conference 04/11/2014 and decide on proceeding with a liver biopsy or additional imaging studies. We will make a surgical referral. She requested a referral to [facility]. I will contact [facility].  Ms. Ahart will return for an office visit and further discussion 04/18/2014. We will consider systemic treatment options if she has metastatic disease. If not we will wait on a surgical opinion to decide on the indication for neoadjuvant therapy.   Approximately  50 minutes were spent with patient today. The majority of time was used for counseling and coordination of care.  [place], [place] 04/10/2014, 6:05 PM

## 2014-04-10 NOTE — Telephone Encounter (Signed)
Gave pt appt for MD and nutirionist appt, gave referral to Dr Eugenia Pancoast to HIM

## 2014-04-11 ENCOUNTER — Other Ambulatory Visit: Payer: Self-pay | Admitting: Oncology

## 2014-04-11 ENCOUNTER — Inpatient Hospital Stay
Admission: RE | Admit: 2014-04-11 | Discharge: 2014-04-11 | Disposition: A | Discharge status: Home / Self Care | Payer: Self-pay | Source: Ambulatory Visit | Attending: Oncology | Admitting: Oncology

## 2014-04-11 ENCOUNTER — Telehealth: Payer: Self-pay | Admitting: *Deleted

## 2014-04-11 ENCOUNTER — Encounter (HOSPITAL_COMMUNITY): Payer: Self-pay | Admitting: Pharmacy Technician

## 2014-04-11 ENCOUNTER — Encounter: Payer: Self-pay | Admitting: *Deleted

## 2014-04-11 ENCOUNTER — Telehealth: Payer: Self-pay | Admitting: Oncology

## 2014-04-11 ENCOUNTER — Other Ambulatory Visit: Payer: Self-pay | Admitting: Radiology

## 2014-04-11 DIAGNOSIS — C4490 Unspecified malignant neoplasm of skin, unspecified: Secondary | ICD-10-CM

## 2014-04-11 DIAGNOSIS — C250 Malignant neoplasm of head of pancreas: Secondary | ICD-10-CM

## 2014-04-11 NOTE — Telephone Encounter (Signed)
Spoke with patient by phone, per request of Dr. Benay Spice, and informed her to expect a call with appointment from IR to have a liver biopsy sometime this week.  MD discussed this with patient at 04/10/14 MD visit and presented plan to GI cancer conference physician meeting this morning.  Patient expressed appreciation for the call and verbalized understanding.

## 2014-04-11 NOTE — Telephone Encounter (Signed)
Pt appt. With Dr. Eugenia Pancoast @ [facility] is 04-17-14@10 :39. Medical records faxed. slides and scans will be fedex'ed. Pt is aware

## 2014-04-11 NOTE — Progress Notes (Signed)
Notified by interventional radiology that liver biopsy has been scheduled for 04/12/14 at 1100/1300. Dr. Bethel Born will perform procedure.

## 2014-04-12 ENCOUNTER — Encounter (HOSPITAL_COMMUNITY): Payer: Self-pay

## 2014-04-12 ENCOUNTER — Ambulatory Visit (HOSPITAL_COMMUNITY)
Admission: RE | Admit: 2014-04-12 | Discharge: 2014-04-12 | Disposition: A | Discharge status: Home / Self Care | Payer: Commercial Managed Care - PPO | Source: Ambulatory Visit | Attending: Oncology | Admitting: Oncology

## 2014-04-12 DIAGNOSIS — K219 Gastro-esophageal reflux disease without esophagitis: Secondary | ICD-10-CM | POA: Insufficient documentation

## 2014-04-12 DIAGNOSIS — C259 Malignant neoplasm of pancreas, unspecified: Secondary | ICD-10-CM | POA: Insufficient documentation

## 2014-04-12 DIAGNOSIS — C250 Malignant neoplasm of head of pancreas: Secondary | ICD-10-CM

## 2014-04-12 DIAGNOSIS — C787 Secondary malignant neoplasm of liver and intrahepatic bile duct: Secondary | ICD-10-CM | POA: Insufficient documentation

## 2014-04-12 DIAGNOSIS — Z85828 Personal history of other malignant neoplasm of skin: Secondary | ICD-10-CM | POA: Insufficient documentation

## 2014-04-12 DIAGNOSIS — Z87891 Personal history of nicotine dependence: Secondary | ICD-10-CM | POA: Insufficient documentation

## 2014-04-12 DIAGNOSIS — K7689 Other specified diseases of liver: Secondary | ICD-10-CM | POA: Insufficient documentation

## 2014-04-12 LAB — CBC
HCT: 35.2 % — ABNORMAL LOW (ref 36.0–46.0)
HEMOGLOBIN: 12 g/dL (ref 12.0–15.0)
MCH: 29.6 pg (ref 26.0–34.0)
MCHC: 34.1 g/dL (ref 30.0–36.0)
MCV: 86.9 fL (ref 78.0–100.0)
Platelets: 363 10*3/uL (ref 150–400)
RBC: 4.05 MIL/uL (ref 3.87–5.11)
RDW: 12.2 % (ref 11.5–15.5)
WBC: 8.2 10*3/uL (ref 4.0–10.5)

## 2014-04-12 LAB — APTT: APTT: 34 s (ref 24–37)

## 2014-04-12 LAB — PROTIME-INR
INR: 1.01 (ref 0.00–1.49)
Prothrombin Time: 13.3 seconds (ref 11.6–15.2)

## 2014-04-12 MED ORDER — MIDAZOLAM HCL 2 MG/2ML IJ SOLN
INTRAMUSCULAR | Status: AC | PRN
  Administered 2014-04-12: 2 mg via INTRAVENOUS

## 2014-04-12 MED ORDER — FENTANYL CITRATE 0.05 MG/ML IJ SOLN
INTRAMUSCULAR | Status: AC | PRN
Start: 2014-04-12 — End: 2014-04-12
  Administered 2014-04-12: 100 ug via INTRAVENOUS

## 2014-04-12 MED ORDER — FENTANYL CITRATE 0.05 MG/ML IJ SOLN
INTRAMUSCULAR | Status: AC
  Filled 2014-04-12: qty 2

## 2014-04-12 MED ORDER — SODIUM CHLORIDE 0.9 % IV SOLN
INTRAVENOUS | Status: DC
  Administered 2014-04-12: 12:00:00 via INTRAVENOUS

## 2014-04-12 MED ORDER — HYDROCODONE-ACETAMINOPHEN 5-325 MG PO TABS
1.0000 | ORAL_TABLET | ORAL | Status: DC | PRN
  Filled 2014-04-12: qty 2

## 2014-04-12 MED ORDER — MIDAZOLAM HCL 2 MG/2ML IJ SOLN
INTRAMUSCULAR | Status: AC
  Filled 2014-04-12: qty 2

## 2014-04-12 NOTE — H&P (Signed)
Agree.  For ultrasound guided liver lesion biopsy today.  Patient seen and examined.

## 2014-04-12 NOTE — Procedures (Signed)
Procedure:  Ultrasound guided core biopsy of liver lesion Findings:  2 cm mass localized in high left lobe near diaphragm. 3 G core biopsy x 2 via 17 G needle under ultrasound guidance.  Solid tissue obtained.

## 2014-04-12 NOTE — H&P (Signed)
Ashley Perry is an 68 y.o. female.   Chief Complaint: "I'm having a liver biopsy" HPI: Patient with recently diagnosed pancreatic carcinoma/biliary stenting and CT revealing liver lesions presents today for US guided liver lesion biopsy.  Past Medical History  Diagnosis Date  . GERD (gastroesophageal reflux disease)   . Ulcer     in college  . Skin cancer   . Asthma     CHILDHOOD ASTHMA  . Nausea LAST 10 DAYS    Past Surgical History  Procedure Laterality Date  . C sections      X 2  . Abdominal hysterectomy    . Back surgery  40YRS AGO    L 5 DISCECTOMY  . Eus N/A 03/29/2014    Procedure: UPPER ENDOSCOPIC ULTRASOUND (EUS) LINEAR;  Surgeon: Milus Banister, MD;  Location: [facility];  Service: Endoscopy;  Laterality: N/A;  . Endoscopic retrograde cholangiopancreatography (ercp) with propofol N/A 03/29/2014    Procedure: ENDOSCOPIC RETROGRADE CHOLANGIOPANCREATOGRAPHY (ERCP) WITH PROPOFOL;  Surgeon: Milus Banister, MD;  Location: [facility];  Service: Endoscopy;  Laterality: N/A;    Family History  Problem Relation Age of Onset  . Colon cancer Neg Hx   . Stomach cancer Neg Hx   . Breast cancer Mother    Social History:  reports that she quit smoking about 52 years ago. She has never used smokeless tobacco. She reports that she drinks alcohol. She reports that she does not use illicit drugs.  Allergies: No Known Allergies  Current outpatient prescriptions:omeprazole (PRILOSEC) 20 MG capsule, Take 20 mg by mouth daily. , Disp: , Rfl:  Current facility-administered medications:0.9 %  sodium chloride infusion, , Intravenous, Continuous, Hedy Jacob, PA-C, Last Rate: 20 mL/hr at 04/12/14 1135   Results for orders placed during the hospital encounter of 04/12/14 (from the past 48 hour(s))  APTT     Status: None   Collection Time    04/12/14 11:17 AM      Result Value Ref Range   aPTT 34  24 - 37 seconds  CBC     Status: Abnormal   Collection Time    04/12/14 11:17  AM      Result Value Ref Range   WBC 8.2  4.0 - 10.5 K/uL   RBC 4.05  3.87 - 5.11 MIL/uL   Hemoglobin 12.0  12.0 - 15.0 g/dL   HCT 35.2 (*) 36.0 - 46.0 %   MCV 86.9  78.0 - 100.0 fL   MCH 29.6  26.0 - 34.0 pg   MCHC 34.1  30.0 - 36.0 g/dL   RDW 12.2  11.5 - 15.5 %   Platelets 363  150 - 400 K/uL  PROTIME-INR     Status: None   Collection Time    04/12/14 11:17 AM      Result Value Ref Range   Prothrombin Time 13.3  11.6 - 15.2 seconds   INR 1.01  0.00 - 1.49   Ct Outside Films Body  04/10/2014   This examination belongs to an outside facility and is stored  here for comparison purposes only.  Contact the originating outside  institution for any associated report or interpretation.   Review of Systems  Constitutional: Positive for weight loss. Negative for fever and chills.  HENT:       Occ HA's  Respiratory: Negative for cough, hemoptysis and shortness of breath.   Cardiovascular: Negative for chest pain.  Gastrointestinal: Positive for abdominal pain. Negative for nausea, vomiting and blood  in stool.  Genitourinary: Negative for dysuria and hematuria.  Musculoskeletal: Negative for back pain.  Endo/Heme/Allergies: Does not bruise/bleed easily.    Blood pressure 146/86, pulse 65, temperature 97.2 F (36.2 C), temperature source Oral, resp. rate 16, SpO2 98.00%. Physical Exam  Constitutional: She is oriented to person, place, and time.  Thin WF in NAD  Cardiovascular: Normal rate and regular rhythm.   Respiratory: Effort normal and breath sounds normal.  GI: Soft. Bowel sounds are normal.  Mild epigastric tenderness  Musculoskeletal: Normal range of motion. She exhibits no edema.  Neurological: She is alert and oriented to person, place, and time.     Assessment/Plan Patient with recently diagnosed pancreatic carcinoma/biliary stenting and CT revealing liver lesions presents today for US guided liver lesion biopsy. Details/risks of procedure d/w pt with her understanding  and consent.  ALLRED,D KEVIN 04/12/2014, 12:47 PM

## 2014-04-12 NOTE — Discharge Instructions (Signed)
Conscious Sedation, Adult, Care After Refer to this sheet in the next few weeks. These instructions provide you with information on caring for yourself after your procedure. Your health care provider may also give you more specific instructions. Your treatment has been planned according to current medical practices, but problems sometimes occur. Call your health care provider if you have any problems or questions after your procedure. WHAT TO EXPECT AFTER THE PROCEDURE  After your procedure:  You may feel sleepy, clumsy, and have poor balance for several hours.  Vomiting may occur if you eat too soon after the procedure. HOME CARE INSTRUCTIONS  Do not participate in any activities where you could become injured for at least 24 hours. Do not:  Drive.  Swim.  Ride a bicycle.  Operate heavy machinery.  Cook.  Use power tools.  Climb ladders.  Work from a high place.  Do not make important decisions or sign legal documents until you are improved.  If you vomit, drink water, juice, or soup when you can drink without vomiting. Make sure you have little or no nausea before eating solid foods.  Only take over-the-counter or prescription medicines for pain, discomfort, or fever as directed by your health care provider.  Make sure you and your family fully understand everything about the medicines given to you, including what side effects may occur.  You should not drink alcohol, take sleeping pills, or take medicines that cause drowsiness for at least 24 hours.  If you smoke, do not smoke without supervision.  If you are feeling better, you may resume normal activities 24 hours after you were sedated.  Keep all appointments with your health care provider. SEEK MEDICAL CARE IF:  Your skin is pale or bluish in color.  You continue to feel nauseous or vomit.  Your pain is getting worse and is not helped by medicine.  You have bleeding or swelling.  You are still sleepy or  feeling clumsy after 24 hours. SEEK IMMEDIATE MEDICAL CARE IF:  You develop a rash.  You have difficulty breathing.  You develop any type of allergic problem.  You have a fever. MAKE SURE YOU:  Understand these instructions.  Will watch your condition.  Will get help right away if you are not doing well or get worse. Document Released: 07/12/2013 Document Reviewed: 07/12/2013 [facility] Patient Information 2015 [place], [place]. This information is not intended to replace advice given to you by your health care provider. Make sure you discuss any questions you have with your health care provider.   Liver Biopsy, Care After Refer to this sheet in the next few weeks. These discharge instructions provide you with general information on caring for yourself after you leave the hospital. Your caregiver may also give you specific instructions. Your treatment has been planned according to the most current medical practices available, but unavoidable complications sometimes occur. If you have any problems or questions after discharge, please call your caregiver. HOME CARE INSTRUCTIONS   You should rest for 1 to 2 days or as instructed.  If you go home the same day as your procedure (outpatient), have a responsible adult take you home and stay with you overnight.  Do not lift more than 5 pounds or play contact sports for 2 weeks.  Do not drive for 24 hours after this test.  Do not take medicine containing aspirin or drink alcohol for 1 week after this test.  Change bandages (dressings) as directed.  Only take over-the-counter or prescription medicines  for pain, discomfort, or fever as directed by your caregiver. OBTAINING YOUR TEST RESULTS Not all test results are available during your visit. If your test results are not back during the visit, make an appointment with your caregiver to find out the results. Do not assume everything is normal if you have not heard from your caregiver or the  medical facility. It is important for you to follow up on all of your test results. SEEK MEDICAL CARE IF:   You have increased bleeding (more than a small spot) from the biopsy site.  You have redness, swelling, or increasing pain in the biopsy site.  You have an oral temperature above 102 F (38.9 C). SEEK IMMEDIATE MEDICAL CARE IF:   You develop swelling or pain in the belly (abdomen).  You develop a rash.  You have difficulty breathing, feel short of breath, or feel faint.  You develop any reaction or side effects to medicines given. MAKE SURE YOU:   Understand these instructions.  Will watch your condition.  Will get help right away if you are not doing well or get worse. Document Released: 04/10/2005 Document Revised: 09/26/2013 Document Reviewed: 05/03/2008 [facility] Patient Information 2015 [place], [place]. This information is not intended to replace advice given to you by your health care provider. Make sure you discuss any questions you have with your health care provider.

## 2014-04-13 ENCOUNTER — Ambulatory Visit (HOSPITAL_BASED_OUTPATIENT_CLINIC_OR_DEPARTMENT_OTHER): Payer: Commercial Managed Care - PPO | Admitting: Oncology

## 2014-04-13 ENCOUNTER — Telehealth: Payer: Self-pay | Admitting: Oncology

## 2014-04-13 VITALS — BP 132/66 | HR 65 | Temp 98.5°F | Resp 18 | Ht 66.0 in | Wt 123.7 lb

## 2014-04-13 DIAGNOSIS — C787 Secondary malignant neoplasm of liver and intrahepatic bile duct: Secondary | ICD-10-CM

## 2014-04-13 DIAGNOSIS — C250 Malignant neoplasm of head of pancreas: Secondary | ICD-10-CM

## 2014-04-13 NOTE — Telephone Encounter (Signed)
gv pt appt schdule for july. lmonvm for Tiffany in IR re appt for port placement - order in EPIC. IR to contact pt. pt also has number for IR and will touch base w/IR if she has not heard from them early next week.

## 2014-04-13 NOTE — Progress Notes (Signed)
[facility] OFFICE PROGRESS NOTE   Diagnosis: Pancreas cancer  INTERVAL HISTORY:   Ms. Scarberry underwent an ultrasound-guided biopsy of a 2 cm left liver lesion 04/12/2014. She tolerated the procedure well. The pathology ([id number]) confirmed metastatic adenocarcinoma.  She continues to have "indigestion ". No new complaint  Objective:  Vital signs in last 24 hours:  Blood pressure 132/6, pulse 65, temperature 98.5 F (36.9 C), temperature source Oral, resp. rate 18, height 5\' 6"  (1.676 m), weight 123 lb 11.2 oz (56.11 kg).   Skin: Biopsy site at the upper abdomen with a bandage in place. No evidence of bleeding.   Lab Results:  Lab Results  Component Value Date   WBC 8.2 04/12/2014   HGB 12.0 04/12/2014   HCT 35.2* 04/12/2014   MCV 86.9 04/12/2014   PLT 363 04/12/2014   NEUTROABS 5.5 03/21/2014    Lab Results  Component Value Date   NA 137 03/21/2014    No results found for this basename: CEA,  CA199,  CA125    Imaging:  US Biopsy  04/12/2014   CLINICAL DATA:  Pancreatic carcinoma liver lesion. The patient requires biopsy of the liver lesion.  EXAM: ULTRASOUND GUIDED CORE BIOPSY OF LIVER  COMPARISON:  CT of the abdomen on 03/22/2014  MEDICATIONS: 2.0 mg IV Versed; 100 mcg IV Fentanyl  Total Moderate Sedation Time: 20 min  PROCEDURE: The procedure, risks, benefits, and alternatives were explained to the patient. Questions regarding the procedure were encouraged and answered. The patient understands and consents to the procedure.  The abdominal wall was prepped with Betadine in a sterile fashion, and a sterile drape was applied covering the operative field. A sterile gown and sterile gloves were used for the procedure. Local anesthesia was provided with 1% Lidocaine.  Ultrasound was performed of the liver. A left lobe liver lesion was localized. Under direct ultrasound guidance, a 17 gauge needle was advanced to the lesion. Two separate coaxial 18 gauge core biopsy  samples were obtained and submitted in formalin.  COMPLICATIONS: None.  FINDINGS: By ultrasound, a partially centrally necrotic rounded lesion is identified in the superior left lobe near the diaphragm. This lesion measures approximately 2.0 x 2.2 cm. Solid tissue was obtained. There were no immediate complications.  IMPRESSION: Ultrasound-guided core biopsy performed of a 2 cm mass in the left lobe of the liver.   Electronically Signed   By: Aletta Edouard M.D.   On: 04/12/2014 16:03   Ct Outside Films Body  04/10/2014   This examination belongs to an outside facility and is stored  here for comparison purposes only.  Contact the originating outside  institution for any associated report or interpretation.   Medications: I have reviewed the patient's current medications.  Assessment/Plan: 1. Adenocarcinoma of the pancreas, clinical stage IV (T3, N1, M1), status post an EUS biopsy of a pancreas head mass 03/29/2014 confirming adenocarcinoma CT and EUS evidence of portal vein abutment  Elevated CA 19-9  Multiple liver lesions on the CT 03/29/2014 concerning for metastases, the largest lesion is new compared to a CT from 09/23/2012 Ultrasound-guided biopsy of a left hepatic dome lesion on 04/12/2014 confirmed metastatic adenocarcinoma 2. obstructive jaundice secondary to #1, status post placement of a bile duct stent 03/29/2014    Disposition:  Ms. Coppa has been diagnosed with metastatic pancreas cancer. I discussed the diagnosis, prognosis, and treatment options. She understands no therapy will be curative. She is scheduled for a surgical evaluation next week. This will be  canceled secondary to the biopsy proven metastatic disease.  I reviewed systemic treatment options with Ms. Schuessler. We discussed standard treatment with the FOLFIRINOX and gemcitabine/Abraxane regimens. I reviewed the general treatment and followup schedule with these regimens.  Ms. Bulger is arranging a second  opinion at the [facility]. This is being scheduled for the week of 04/16/2014. She will return for an office visit here 04/25/2014.  We will refer her for placement of a Port-A-Cath. She will be scheduled to see the genetics counselor.  Betsy Coder, MD  04/13/2014  5:02 PM

## 2014-04-17 ENCOUNTER — Telehealth: Payer: Self-pay | Admitting: Oncology

## 2014-04-17 ENCOUNTER — Encounter (HOSPITAL_COMMUNITY): Payer: Self-pay | Admitting: Pharmacy Technician

## 2014-04-17 NOTE — Telephone Encounter (Signed)
Pt called to r/s apt due to complications r/s 10/25 ....Marland KitchenMarland KitchenKJ

## 2014-04-17 NOTE — Telephone Encounter (Signed)
Faxed pt medical records to [facility]

## 2014-04-18 ENCOUNTER — Encounter: Payer: Commercial Managed Care - PPO | Admitting: Nutrition

## 2014-04-18 ENCOUNTER — Ambulatory Visit: Payer: Commercial Managed Care - PPO | Admitting: Oncology

## 2014-04-19 ENCOUNTER — Other Ambulatory Visit: Payer: Self-pay | Admitting: Radiology

## 2014-04-19 ENCOUNTER — Telehealth: Payer: Self-pay | Admitting: Oncology

## 2014-04-19 ENCOUNTER — Other Ambulatory Visit: Payer: Self-pay | Admitting: *Deleted

## 2014-04-19 ENCOUNTER — Telehealth: Payer: Self-pay | Admitting: *Deleted

## 2014-04-19 MED ORDER — LIDOCAINE-PRILOCAINE 2.5-2.5 % EX CREA: 1.0000 "application " | TOPICAL_CREAM | CUTANEOUS | Status: DC | PRN

## 2014-04-19 MED ORDER — PROCHLORPERAZINE MALEATE 10 MG PO TABS: 10.0000 mg | ORAL_TABLET | Freq: Four times a day (QID) | ORAL | Status: DC | PRN

## 2014-04-19 NOTE — Telephone Encounter (Signed)
VM requesting to be seen today by Dr. Benay Spice since she was worked in sooner with [employer], she requests to be seen today to "get things moving. I can get there in 7 minutes". Dr. Benay Spice made aware of request-schedule does not allow her to be worked in today. Attempted to obtain note from [facility] via Care Everywhere-was denied -patient needs to sign a release. Noted she had rescheduled her appointment from 7/22 (MD ordered) to 7/20 and was double-booked. Called lead scheduler to get her moved back to the appointment Dr. Benay Spice had originally requested.

## 2014-04-19 NOTE — Telephone Encounter (Signed)
received call from desk nurse that pt appt had been r/s from 7/22 to 7/20 and f/u on 7/20 was overbooked. per desk nurse appt has to be moved back to 7/22. appt moved back to 7/22 for lab @ 8am and f/u @ 8:30am. s/w pt she is aware.

## 2014-04-19 NOTE — Telephone Encounter (Signed)
Per Dr. Benay Spice : Will have lab/chemo class on Monday (7/20-scheduled). Will see her on Wednesday at 0800 (he will leave GI conference early to work her in). Will try to schedule for chemo on Wednesday after visit with him. If his call day is light on Monday, he may call her in to see him that afternoon.  Notified scheduler of changes.

## 2014-04-19 NOTE — Addendum Note (Signed)
Addended by: Tania Ade on: 04/19/2014 05:48 PM   Modules accepted: Orders

## 2014-04-19 NOTE — Telephone Encounter (Signed)
Called patient to make her aware that she needs to sign release for [place] to access her [facility] data. She will come by office tomorrow to sign this as well as pick up her letter MD dictated to cancel her air travel.  She reports the MD at [facility] was suggesting the FOLFIRINOX treatment regimen and she would love to get started next week after her port is placed on 7/21. Made her aware that she was already set up for the chemo class on Monday at 10 am. Will need to reschedule her Monday office visit to error in scheduling when she called to reschedule. She is willing to see MD Wednesday and start chemo that day if possible. Will call in her EMLA and Compazine. Has some Zofran 4 mg tabs on hand already from GI.

## 2014-04-20 ENCOUNTER — Telehealth: Payer: Self-pay | Admitting: Oncology

## 2014-04-20 ENCOUNTER — Encounter: Payer: Self-pay | Admitting: *Deleted

## 2014-04-20 ENCOUNTER — Telehealth: Payer: Self-pay | Admitting: *Deleted

## 2014-04-20 DIAGNOSIS — C259 Malignant neoplasm of pancreas, unspecified: Secondary | ICD-10-CM

## 2014-04-20 NOTE — Telephone Encounter (Signed)
PER DESK NURSE (SUSAN) ON 7/16 BS WOULD LIKE PT TO HAVE LB DAY OF CHED AND SEE HIM 7/22 AT 8AM INSTEAD OF 830AM. PER NURSE [facility] MOVED LAB BUT WAS NOT ABLE TO CHANGE TIME OF F/U. PER NURSE F/U TIME CHANGED TO 8AM. PER DESK NURSE PT AWARE.

## 2014-04-20 NOTE — Progress Notes (Signed)
DR.ANA MARIA CRISTINA DE JESUS-ACOSTA CALLED WITH HER RECOMMENDATIONS. FOLFIRINOX STANDARD BUT NO 5FU BOLUS FOR SIX MONTHS. FOLLOW UP CT SCANS AT [facility]. THEN PT. COULD BE ELIGIBLE FOR A CLINICAL TRIAL AT [facility].

## 2014-04-20 NOTE — Progress Notes (Signed)
Progress note from [facility] visit not available at this time.

## 2014-04-20 NOTE — Telephone Encounter (Signed)
Per 07/16 POF add 07/23 port d/c 07/24, pt will get updated schedule at next apt...Marland KitchenMarland KitchenKJ

## 2014-04-20 NOTE — Progress Notes (Signed)
Ashley Perry signed release to obtain records from [place] from [employer]. Noted labs w/CA 19.9=389.3 and CT results. No office note with recommendations has been released yet by  Dr. Collie Siad de Jesus-Acosta.  Sent fax to [phone] requesting MD release note for Dr. Benay Spice to plan chemo. Brought in form for completion in regards to her employer-forwarded these to [facility] in managed care department.  Dr. Benay Spice still wants to repeat her CA 19.9 here before treatment due to variations in reference ranges per different labs.

## 2014-04-23 ENCOUNTER — Telehealth: Payer: Self-pay | Admitting: Oncology

## 2014-04-23 ENCOUNTER — Telehealth: Payer: Self-pay | Admitting: *Deleted

## 2014-04-23 ENCOUNTER — Other Ambulatory Visit: Payer: Commercial Managed Care - PPO

## 2014-04-23 ENCOUNTER — Other Ambulatory Visit (HOSPITAL_BASED_OUTPATIENT_CLINIC_OR_DEPARTMENT_OTHER): Payer: Commercial Managed Care - PPO

## 2014-04-23 ENCOUNTER — Other Ambulatory Visit: Payer: Self-pay | Admitting: Oncology

## 2014-04-23 ENCOUNTER — Ambulatory Visit (HOSPITAL_BASED_OUTPATIENT_CLINIC_OR_DEPARTMENT_OTHER): Payer: Commercial Managed Care - PPO | Admitting: Oncology

## 2014-04-23 ENCOUNTER — Encounter: Payer: Self-pay | Admitting: *Deleted

## 2014-04-23 ENCOUNTER — Ambulatory Visit: Payer: Commercial Managed Care - PPO | Admitting: Oncology

## 2014-04-23 VITALS — BP 136/67 | HR 61 | Temp 98.0°F | Resp 19 | Ht 66.0 in | Wt 123.3 lb

## 2014-04-23 DIAGNOSIS — K838 Other specified diseases of biliary tract: Secondary | ICD-10-CM

## 2014-04-23 DIAGNOSIS — C787 Secondary malignant neoplasm of liver and intrahepatic bile duct: Secondary | ICD-10-CM

## 2014-04-23 DIAGNOSIS — C250 Malignant neoplasm of head of pancreas: Secondary | ICD-10-CM

## 2014-04-23 DIAGNOSIS — C259 Malignant neoplasm of pancreas, unspecified: Secondary | ICD-10-CM

## 2014-04-23 NOTE — Telephone Encounter (Signed)
gv pt appt schedule for july/aug. message to BS to confirm if 7/30 lab is needed there is no order.

## 2014-04-23 NOTE — Progress Notes (Signed)
Spent time with patient and her friend in chemo class.  Reviewed side effects of Irinotecan, 5FU, Oxaliplatin and Leucovorin.  Reviewed portacath care and ambulatory pump information.  teachback done.

## 2014-04-23 NOTE — Progress Notes (Signed)
  [facility] OFFICE PROGRESS NOTE   Diagnosis: Pancreas cancer  INTERVAL HISTORY:   Ms. Ashley Perry returns as scheduled. She was seen for a second opinion at [facility] by Dr. Youlanda Mighty. A CT confirmed multiple liver metastases. Treatment with FOLFIRINOX is recommended.  Ashley Perry reports improvement in "bloating" since beginning the pancreatic enzyme replacement. She has noted mild positional discomfort at the anterior chest wall after the liver biopsy. No dyspnea or cough. The discomfort is near the inferior aspect of the right breast.  Objective:  Vital signs in last 24 hours:  Blood pressure 136/67, pulse 61, temperature 98 F (36.7 C), temperature source Oral, resp. rate 19, height 5\' 6"  (1.676 m), weight 123 lb 4.8 oz (55.929 kg), SpO2 100.00%.    HEENT: Neck without mass Resp: Lungs clear bilaterally Cardio: Regular rate and rhythm GI: No hepatomegaly, nontender, no mass Vascular: No leg edema  Musculoskeletal: No tenderness at the right anterior chest wall in the area of discomfort near the inferior aspect of the medial right breast.    Lab Results:  Lab Results  Component Value Date   WBC 8.2 04/12/2014   HGB 12.0 04/12/2014   HCT 35.2* 04/12/2014   MCV 86.9 04/12/2014   PLT 363 04/12/2014   NEUTROABS 5.5 03/21/2014   04/18/2014 at [facility]-creatinine 0.7, bilirubin 0.8, alkaline phosphatase face 28, AST 31, ALT 39, bilirubin 0.8 Imaging:  No results found.  Medications: I have reviewed the patient's current medications.  Assessment/Plan: 1. Adenocarcinoma of the pancreas, clinical stage IV (T3, N1, M1), status post an EUS biopsy of a pancreas head mass 03/29/2014 confirming adenocarcinoma CT and EUS evidence of portal vein abutment  Elevated CA 19-9  Multiple liver lesions on the CT 03/29/2014 concerning for metastases, the largest lesion is new compared to a CT from 09/23/2012  Ultrasound-guided biopsy of a left  hepatic dome lesion on 04/12/2014 confirmed metastatic adenocarcinoma   2. obstructive jaundice secondary to #1, status post placement of a bile duct stent 03/29/2014    Disposition:   Ashley Perry appears stable. She was seen for a second opinion at the [facility]. They are in agreement with beginning systemic therapy. The plan is to start modified FOLFIRINOX this week.  I discussed the potential toxicities associated with this regimen including the chance for nausea/vomiting, mucositis, diarrhea, alopecia, and hematologic toxicity. We discussed the abdominal pain and diarrhea associated with irinotecan. We reviewed the various types of neuropathy seen with oxaliplatin. We discussed the rash, hyperpigmentation, and hand/foot syndrome related to 5-fluorouracil. She agrees to proceed. Ashley Perry has attended a chemotherapy teaching class.  She is scheduled for a Port-A-Cath placement 04/24/2014. A first cycle of FOLFIRINOX is scheduled 04/25/2014. We will check a baseline CA 19-9 when she is here 04/25/2014.  Ashley Perry is scheduled for cycle 2 FOLFIRINOX and an office visit 05/10/2014.  Betsy Coder, MD  04/23/2014  5:49 PM

## 2014-04-23 NOTE — Telephone Encounter (Signed)
Per Dr. Benay Spice; notified pt that MD can see her today between 4-4:30--could she come today?  Pt stated she will be here at 4pm today.

## 2014-04-23 NOTE — Telephone Encounter (Signed)
Per staff message and POF I have scheduled appts. Advised scheduler of appts. JMW  

## 2014-04-23 NOTE — Telephone Encounter (Signed)
Pt cancelled genetics and nutriction class, will r/s later and will spk w/Dr. Benay Spice at her next visit  concerning this matter.Marland Kitchen..KJ

## 2014-04-24 ENCOUNTER — Other Ambulatory Visit: Payer: Self-pay | Admitting: Oncology

## 2014-04-24 ENCOUNTER — Encounter (HOSPITAL_COMMUNITY): Payer: Self-pay

## 2014-04-24 ENCOUNTER — Ambulatory Visit (HOSPITAL_COMMUNITY)
Admission: RE | Admit: 2014-04-24 | Discharge: 2014-04-24 | Disposition: A | Discharge status: Home / Self Care | Payer: Commercial Managed Care - PPO | Source: Ambulatory Visit | Attending: Oncology | Admitting: Oncology

## 2014-04-24 DIAGNOSIS — Z87891 Personal history of nicotine dependence: Secondary | ICD-10-CM | POA: Insufficient documentation

## 2014-04-24 DIAGNOSIS — Z79899 Other long term (current) drug therapy: Secondary | ICD-10-CM | POA: Insufficient documentation

## 2014-04-24 DIAGNOSIS — C259 Malignant neoplasm of pancreas, unspecified: Secondary | ICD-10-CM | POA: Insufficient documentation

## 2014-04-24 DIAGNOSIS — J45909 Unspecified asthma, uncomplicated: Secondary | ICD-10-CM | POA: Insufficient documentation

## 2014-04-24 DIAGNOSIS — C250 Malignant neoplasm of head of pancreas: Secondary | ICD-10-CM

## 2014-04-24 DIAGNOSIS — K219 Gastro-esophageal reflux disease without esophagitis: Secondary | ICD-10-CM | POA: Insufficient documentation

## 2014-04-24 LAB — CBC WITH DIFFERENTIAL/PLATELET
BASOS ABS: 0.2 10*3/uL — AB (ref 0.0–0.1)
Basophils Relative: 2 % — ABNORMAL HIGH (ref 0–1)
EOS PCT: 8 % — AB (ref 0–5)
Eosinophils Absolute: 0.7 10*3/uL (ref 0.0–0.7)
HEMATOCRIT: 33.8 % — AB (ref 36.0–46.0)
Hemoglobin: 11.3 g/dL — ABNORMAL LOW (ref 12.0–15.0)
LYMPHS ABS: 1.8 10*3/uL (ref 0.7–4.0)
Lymphocytes Relative: 20 % (ref 12–46)
MCH: 29 pg (ref 26.0–34.0)
MCHC: 33.4 g/dL (ref 30.0–36.0)
MCV: 86.7 fL (ref 78.0–100.0)
MONO ABS: 0.9 10*3/uL (ref 0.1–1.0)
Monocytes Relative: 10 % (ref 3–12)
Neutro Abs: 5.4 10*3/uL (ref 1.7–7.7)
Neutrophils Relative %: 60 % (ref 43–77)
PLATELETS: 288 10*3/uL (ref 150–400)
RBC: 3.9 MIL/uL (ref 3.87–5.11)
RDW: 12.2 % (ref 11.5–15.5)
WBC: 9 10*3/uL (ref 4.0–10.5)

## 2014-04-24 LAB — PROTIME-INR
INR: 0.96 (ref 0.00–1.49)
PROTHROMBIN TIME: 12.8 s (ref 11.6–15.2)

## 2014-04-24 MED ORDER — HEPARIN SOD (PORK) LOCK FLUSH 100 UNIT/ML IV SOLN
INTRAVENOUS | Status: AC | PRN
  Administered 2014-04-24: 500 [IU]

## 2014-04-24 MED ORDER — LIDOCAINE HCL 1 % IJ SOLN
INTRAMUSCULAR | Status: AC
  Filled 2014-04-24: qty 20

## 2014-04-24 MED ORDER — CEFAZOLIN SODIUM-DEXTROSE 2-3 GM-% IV SOLR
2.0000 g | Freq: Once | INTRAVENOUS | Status: AC
  Administered 2014-04-24: 2 g via INTRAVENOUS

## 2014-04-24 MED ORDER — SODIUM CHLORIDE 0.9 % IV SOLN
INTRAVENOUS | Status: DC
  Administered 2014-04-24: 13:00:00 via INTRAVENOUS

## 2014-04-24 MED ORDER — MIDAZOLAM HCL 2 MG/2ML IJ SOLN
INTRAMUSCULAR | Status: AC
  Filled 2014-04-24: qty 6

## 2014-04-24 MED ORDER — CEFAZOLIN SODIUM-DEXTROSE 2-3 GM-% IV SOLR
INTRAVENOUS | Status: AC
  Administered 2014-04-24: 2 g via INTRAVENOUS
  Filled 2014-04-24: qty 50

## 2014-04-24 MED ORDER — MIDAZOLAM HCL 2 MG/2ML IJ SOLN
INTRAMUSCULAR | Status: AC | PRN
  Administered 2014-04-24 (×4): 1 mg via INTRAVENOUS

## 2014-04-24 MED ORDER — LIDOCAINE-EPINEPHRINE (PF) 2 %-1:200000 IJ SOLN
INTRAMUSCULAR | Status: AC
  Filled 2014-04-24: qty 20

## 2014-04-24 MED ORDER — FENTANYL CITRATE 0.05 MG/ML IJ SOLN
INTRAMUSCULAR | Status: AC
  Filled 2014-04-24: qty 6

## 2014-04-24 MED ORDER — HEPARIN SOD (PORK) LOCK FLUSH 100 UNIT/ML IV SOLN
INTRAVENOUS | Status: AC
  Filled 2014-04-24: qty 5

## 2014-04-24 MED ORDER — FENTANYL CITRATE 0.05 MG/ML IJ SOLN
INTRAMUSCULAR | Status: AC | PRN
  Administered 2014-04-24 (×2): 50 ug via INTRAVENOUS

## 2014-04-24 NOTE — Discharge Instructions (Signed)
Implanted Port Insertion, Care After °Refer to this sheet in the next few weeks. These instructions provide you with information on caring for yourself after your procedure. Your health care provider may also give you more specific instructions. Your treatment has been planned according to current medical practices, but problems sometimes occur. Call your health care provider if you have any problems or questions after your procedure. °WHAT TO EXPECT AFTER THE PROCEDURE °After your procedure, it is typical to have the following:  °· Discomfort at the port insertion site. Ice packs to the area will help. °· Bruising on the skin over the port. This will subside in 3-4 days. °HOME CARE INSTRUCTIONS °· After your port is placed, you will get a manufacturer's information card. The card has information about your port. Keep this card with you at all times.   °· Know what kind of port you have. There are many types of ports available.   °· Wear a medical alert bracelet in case of an emergency. This can help alert health care workers that you have a port.   °· The port can stay in for as long as your health care provider believes it is necessary.   °· A home health care nurse may give medicines and take care of the port.   °· You or a family member can get special training and directions for giving medicine and taking care of the port at home.   °SEEK MEDICAL CARE IF:  °· Your port does not flush or you are unable to get a blood return.   °· You have a fever or chills. °SEEK IMMEDIATE MEDICAL CARE IF: °· You have new fluid or pus coming from your incision.   °· You notice a bad smell coming from your incision site.   °· You have swelling, pain, or more redness at the incision or port site.   °· You have chest pain or shortness of breath. °Document Released: 07/12/2013 Document Revised: 09/26/2013 Document Reviewed: 07/12/2013 °ExitCare® Patient Information ©2015 [employer]. This information is not intended to replace  advice given to you by your health care provider. Make sure you discuss any questions you have with your health care provider. °Conscious Sedation, Adult, Care After °Refer to this sheet in the next few weeks. These instructions provide you with information on caring for yourself after your procedure. Your health care provider may also give you more specific instructions. Your treatment has been planned according to current medical practices, but problems sometimes occur. Call your health care provider if you have any problems or questions after your procedure. °WHAT TO EXPECT AFTER THE PROCEDURE  °After your procedure: °· You may feel sleepy, clumsy, and have poor balance for several hours. °· Vomiting may occur if you eat too soon after the procedure. °HOME CARE INSTRUCTIONS °· Do not participate in any activities where you could become injured for at least 24 hours. Do not: °¨ Drive. °¨ Swim. °¨ Ride a bicycle. °¨ Operate heavy machinery. °¨ Cook. °¨ Use power tools. °¨ Climb ladders. °¨ Work from a high place. °· Do not make important decisions or sign legal documents until you are improved. °· If you vomit, drink water, juice, or soup when you can drink without vomiting. Make sure you have little or no nausea before eating solid foods. °· Only take over-the-counter or prescription medicines for pain, discomfort, or fever as directed by your health care provider. °· Make sure you and your family fully understand everything about the medicines given to you, including what side effects   may occur. °· You should not drink alcohol, take sleeping pills, or take medicines that cause drowsiness for at least 24 hours. °· If you smoke, do not smoke without supervision. °· If you are feeling better, you may resume normal activities 24 hours after you were sedated. °· Keep all appointments with your health care provider. °SEEK MEDICAL CARE IF: °· Your skin is pale or bluish in color. °· You continue to feel nauseous or  vomit. °· Your pain is getting worse and is not helped by medicine. °· You have bleeding or swelling. °· You are still sleepy or feeling clumsy after 24 hours. °SEEK IMMEDIATE MEDICAL CARE IF: °· You develop a rash. °· You have difficulty breathing. °· You develop any type of allergic problem. °· You have a fever. °MAKE SURE YOU: °· Understand these instructions. °· Will watch your condition. °· Will get help right away if you are not doing well or get worse. °Document Released: 07/12/2013 Document Reviewed: 07/12/2013 °ExitCare® Patient Information ©2015 [employer]. This information is not intended to replace advice given to you by your health care provider. Make sure you discuss any questions you have with your health care provider. ° ° °

## 2014-04-24 NOTE — H&P (Signed)
Ashley Perry is an 68 y.o. female.   Chief Complaint: "I'm having a port put in" HPI: Patient with history of stage IV pancreatic carcinoma presents today for port a cath placement for chemotherapy.  Past Medical History  Diagnosis Date  . GERD (gastroesophageal reflux disease)   . Ulcer     in college  . Skin cancer   . Asthma     CHILDHOOD ASTHMA  . Nausea LAST 10 DAYS    Past Surgical History  Procedure Laterality Date  . C sections      X 2  . Abdominal hysterectomy    . Back surgery  22YRS AGO    L 5 DISCECTOMY  . Eus N/A 03/29/2014    Procedure: UPPER ENDOSCOPIC ULTRASOUND (EUS) LINEAR;  Surgeon: Milus Banister, MD;  Location: [facility];  Service: Endoscopy;  Laterality: N/A;  . Endoscopic retrograde cholangiopancreatography (ercp) with propofol N/A 03/29/2014    Procedure: ENDOSCOPIC RETROGRADE CHOLANGIOPANCREATOGRAPHY (ERCP) WITH PROPOFOL;  Surgeon: Milus Banister, MD;  Location: [facility];  Service: Endoscopy;  Laterality: N/A;    Family History  Problem Relation Age of Onset  . Colon cancer Neg Hx   . Stomach cancer Neg Hx   . Breast cancer Mother    Social History:  reports that she quit smoking about 52 years ago. She has never used smokeless tobacco. She reports that she drinks alcohol. She reports that she does not use illicit drugs.  Allergies: No Known Allergies  Current outpatient prescriptions:lipase/protease/amylase (CREON-12/PANCREASE) 12000 UNITS CPEP capsule, Take 2 capsules by mouth 3 (three) times daily before meals., Disp: 270 capsule, Rfl: ;  lidocaine-prilocaine (EMLA) cream, Apply 1 application topically as needed. Apply to PAC 1-2 hours prior to stick and cover with plastic wrap, Disp: 30 g, Rfl: 11;  omeprazole (PRILOSEC) 20 MG capsule, Take 20 mg by mouth daily. , Disp: , Rfl:  prochlorperazine (COMPAZINE) 10 MG tablet, Take 1 tablet (10 mg total) by mouth every 6 (six) hours as needed for nausea., Disp: 60 tablet, Rfl: 1 Current  facility-administered medications:0.9 %  sodium chloride infusion, , Intravenous, Continuous, Hedy Jacob, PA-C, Last Rate: 50 mL/hr at 04/24/14 1300;  ceFAZolin (ANCEF) IVPB 2 g/50 mL premix, 2 g, Intravenous, Once, Hedy Jacob, PA-C   Results for orders placed during the hospital encounter of 04/24/14 (from the past 48 hour(s))  CBC WITH DIFFERENTIAL     Status: Abnormal   Collection Time    04/24/14 12:25 PM      Result Value Ref Range   WBC 9.0  4.0 - 10.5 K/uL   RBC 3.90  3.87 - 5.11 MIL/uL   Hemoglobin 11.3 (*) 12.0 - 15.0 g/dL   HCT 33.8 (*) 36.0 - 46.0 %   MCV 86.7  78.0 - 100.0 fL   MCH 29.0  26.0 - 34.0 pg   MCHC 33.4  30.0 - 36.0 g/dL   RDW 12.2  11.5 - 15.5 %   Platelets 288  150 - 400 K/uL   Neutrophils Relative % 60  43 - 77 %   Neutro Abs 5.4  1.7 - 7.7 K/uL   Lymphocytes Relative 20  12 - 46 %   Lymphs Abs 1.8  0.7 - 4.0 K/uL   Monocytes Relative 10  3 - 12 %   Monocytes Absolute 0.9  0.1 - 1.0 K/uL   Eosinophils Relative 8 (*) 0 - 5 %   Eosinophils Absolute 0.7  0.0 - 0.7 K/uL  Basophils Relative 2 (*) 0 - 1 %   Basophils Absolute 0.2 (*) 0.0 - 0.1 K/uL  PROTIME-INR     Status: None   Collection Time    04/24/14 12:25 PM      Result Value Ref Range   Prothrombin Time 12.8  11.6 - 15.2 seconds   INR 0.96  0.00 - 1.49   No results found.  Review of Systems  Constitutional: Negative for fever and chills.  Respiratory: Negative for cough and shortness of breath.   Cardiovascular: Negative for chest pain.  Gastrointestinal: Negative for vomiting, abdominal pain and blood in stool.       Occ nausea  Musculoskeletal: Negative for back pain.  Neurological: Negative for headaches.  Endo/Heme/Allergies: Does not bruise/bleed easily.    Blood pressure 123/76, pulse 65, temperature 97.1 F (36.2 C), temperature source Oral, resp. rate 18, SpO2 97.00%. Physical Exam  Constitutional: She is oriented to person, place, and time. She appears well-developed  and well-nourished.  Cardiovascular: Normal rate and regular rhythm.   Respiratory: Effort normal and breath sounds normal.  GI: Soft. Bowel sounds are normal. There is no tenderness.  Musculoskeletal: Normal range of motion. She exhibits no edema.  Neurological: She is alert and oriented to person, place, and time.     Assessment/Plan Patient with history of stage IV pancreatic carcinoma presents today for port a cath placement for chemotherapy. Details/risks of procedure d/w pt with her understanding and consent.  ALLRED,D KEVIN 04/24/2014, 2:14 PM

## 2014-04-24 NOTE — Procedures (Signed)
Placement of right IJ portacath.  Tip in lower SVC and ready to use.

## 2014-04-25 ENCOUNTER — Ambulatory Visit (HOSPITAL_BASED_OUTPATIENT_CLINIC_OR_DEPARTMENT_OTHER): Payer: Commercial Managed Care - PPO

## 2014-04-25 ENCOUNTER — Other Ambulatory Visit: Payer: Commercial Managed Care - PPO

## 2014-04-25 ENCOUNTER — Ambulatory Visit: Payer: Commercial Managed Care - PPO | Admitting: Oncology

## 2014-04-25 ENCOUNTER — Encounter: Payer: Self-pay | Admitting: Oncology

## 2014-04-25 VITALS — BP 164/80 | HR 64 | Temp 98.0°F | Resp 18

## 2014-04-25 DIAGNOSIS — Z5111 Encounter for antineoplastic chemotherapy: Secondary | ICD-10-CM

## 2014-04-25 DIAGNOSIS — C787 Secondary malignant neoplasm of liver and intrahepatic bile duct: Secondary | ICD-10-CM

## 2014-04-25 DIAGNOSIS — C250 Malignant neoplasm of head of pancreas: Secondary | ICD-10-CM

## 2014-04-25 DIAGNOSIS — C259 Malignant neoplasm of pancreas, unspecified: Secondary | ICD-10-CM

## 2014-04-25 MED ORDER — IRINOTECAN HCL CHEMO INJECTION 100 MG/5ML
180.0000 mg/m2 | Freq: Once | INTRAVENOUS | Status: AC
  Administered 2014-04-25: 290 mg via INTRAVENOUS
  Filled 2014-04-25: qty 14.5

## 2014-04-25 MED ORDER — ATROPINE SULFATE 1 MG/ML IJ SOLN
INTRAMUSCULAR | Status: AC
Start: 2014-04-25 — End: 2014-04-25
  Filled 2014-04-25: qty 1

## 2014-04-25 MED ORDER — LEUCOVORIN CALCIUM INJECTION 350 MG
400.0000 mg/m2 | Freq: Once | INTRAVENOUS | Status: AC
  Administered 2014-04-25: 644 mg via INTRAVENOUS
  Filled 2014-04-25: qty 32.2

## 2014-04-25 MED ORDER — SODIUM CHLORIDE 0.9 % IV SOLN
2400.0000 mg/m2 | INTRAVENOUS | Status: DC
  Administered 2014-04-25: 3850 mg via INTRAVENOUS
  Filled 2014-04-25: qty 77

## 2014-04-25 MED ORDER — DEXAMETHASONE SODIUM PHOSPHATE 20 MG/5ML IJ SOLN
INTRAMUSCULAR | Status: AC
  Filled 2014-04-25: qty 5

## 2014-04-25 MED ORDER — ATROPINE SULFATE 1 MG/ML IJ SOLN
0.5000 mg | Freq: Once | INTRAMUSCULAR | Status: AC | PRN
  Administered 2014-04-25: 0.5 mg via INTRAVENOUS

## 2014-04-25 MED ORDER — ONDANSETRON 16 MG/50ML IVPB (CHCC)
INTRAVENOUS | Status: AC
  Filled 2014-04-25: qty 16

## 2014-04-25 MED ORDER — DEXAMETHASONE SODIUM PHOSPHATE 20 MG/5ML IJ SOLN
20.0000 mg | Freq: Once | INTRAMUSCULAR | Status: AC
Start: 2014-04-25 — End: 2014-04-25
  Administered 2014-04-25: 20 mg via INTRAVENOUS

## 2014-04-25 MED ORDER — OXALIPLATIN CHEMO INJECTION 100 MG/20ML
85.0000 mg/m2 | Freq: Once | INTRAVENOUS | Status: AC
  Administered 2014-04-25: 135 mg via INTRAVENOUS
  Filled 2014-04-25: qty 27

## 2014-04-25 MED ORDER — DEXTROSE 5 % IV SOLN
Freq: Once | INTRAVENOUS | Status: AC
  Administered 2014-04-25: 10:00:00 via INTRAVENOUS

## 2014-04-25 MED ORDER — ONDANSETRON 16 MG/50ML IVPB (CHCC)
16.0000 mg | Freq: Once | INTRAVENOUS | Status: AC
  Administered 2014-04-25: 16 mg via INTRAVENOUS

## 2014-04-25 NOTE — Patient Instructions (Signed)
[facility] Discharge Instructions for Patients Receiving Chemotherapy  Today you received the following chemotherapy agents; Oxaliplatin, Leucovorin, Irinotecan and Adrucil (home infusion pump).  To help prevent nausea and vomiting after your treatment, we encourage you to take your nausea medication.   If you develop nausea and vomiting that is not controlled by your nausea medication, call the clinic.   BELOW ARE SYMPTOMS THAT SHOULD BE REPORTED IMMEDIATELY:  *FEVER GREATER THAN 100.5 F  *CHILLS WITH OR WITHOUT FEVER  NAUSEA AND VOMITING THAT IS NOT CONTROLLED WITH YOUR NAUSEA MEDICATION  *UNUSUAL SHORTNESS OF BREATH  *UNUSUAL BRUISING OR BLEEDING  TENDERNESS IN MOUTH AND THROAT WITH OR WITHOUT PRESENCE OF ULCERS  *URINARY PROBLEMS  *BOWEL PROBLEMS  UNUSUAL RASH Items with * indicate a potential emergency and should be followed up as soon as possible.  Feel free to call the clinic you have any questions or concerns. The clinic phone number is [phone].

## 2014-04-25 NOTE — Progress Notes (Signed)
Faxed trip cancellation form to [phone]

## 2014-04-26 ENCOUNTER — Telehealth: Payer: Self-pay | Admitting: *Deleted

## 2014-04-26 NOTE — Telephone Encounter (Signed)
Message copied by Cherylynn Ridges on Thu Apr 26, 2014 11:57 AM ------      Message from: Maxwell Marion      Created: Wed Apr 25, 2014  4:02 PM      Regarding: Chemo f/u      Contact: [phone]       First time w/ oxaliplatin, leucovorin, irinotecan and 5FU pump. Pump d/c 07/24            Dr. Benay Spice ------

## 2014-04-26 NOTE — Telephone Encounter (Signed)
Called Ashley Perry at [phone] number(s).  Message left requesting a return call for chemotherapy follow up.  Awaiting return call from patient.

## 2014-04-26 NOTE — Telephone Encounter (Signed)
Return call message left on collaborative voicemail indicates patient had trouble sleeping.  Asked for advice on what to take for rest stating she "has valium 5 mg on hand from previous stressful times and benadryl 25 mg".  Called patient who reports the atropine she received yesterday for bowels had her wired up.  Took imodium this morning but no diarrhea since yesterday but "felt like it was coming on again".  Took compazine last night but did not like this.  Informed her compazine is for nausea.  Reviewed use of imodium and suggested she try benadryl for sleep.  No further symptoms or questions.

## 2014-04-27 ENCOUNTER — Encounter: Payer: Self-pay | Admitting: Nurse Practitioner

## 2014-04-27 ENCOUNTER — Ambulatory Visit (HOSPITAL_BASED_OUTPATIENT_CLINIC_OR_DEPARTMENT_OTHER): Payer: Commercial Managed Care - PPO | Admitting: Nurse Practitioner

## 2014-04-27 ENCOUNTER — Telehealth: Payer: Self-pay | Admitting: *Deleted

## 2014-04-27 ENCOUNTER — Other Ambulatory Visit: Payer: Self-pay | Admitting: *Deleted

## 2014-04-27 ENCOUNTER — Ambulatory Visit (HOSPITAL_BASED_OUTPATIENT_CLINIC_OR_DEPARTMENT_OTHER): Payer: Commercial Managed Care - PPO

## 2014-04-27 ENCOUNTER — Ambulatory Visit: Payer: Commercial Managed Care - PPO

## 2014-04-27 VITALS — BP 108/67 | HR 76 | Temp 97.6°F

## 2014-04-27 DIAGNOSIS — R112 Nausea with vomiting, unspecified: Secondary | ICD-10-CM | POA: Insufficient documentation

## 2014-04-27 DIAGNOSIS — R5381 Other malaise: Secondary | ICD-10-CM

## 2014-04-27 DIAGNOSIS — C259 Malignant neoplasm of pancreas, unspecified: Secondary | ICD-10-CM

## 2014-04-27 DIAGNOSIS — C250 Malignant neoplasm of head of pancreas: Secondary | ICD-10-CM | POA: Diagnosis not present

## 2014-04-27 DIAGNOSIS — C787 Secondary malignant neoplasm of liver and intrahepatic bile duct: Secondary | ICD-10-CM

## 2014-04-27 DIAGNOSIS — E860 Dehydration: Secondary | ICD-10-CM | POA: Insufficient documentation

## 2014-04-27 DIAGNOSIS — R5383 Other fatigue: Secondary | ICD-10-CM | POA: Insufficient documentation

## 2014-04-27 DIAGNOSIS — R531 Weakness: Secondary | ICD-10-CM | POA: Insufficient documentation

## 2014-04-27 MED ORDER — SODIUM CHLORIDE 0.9 % IV SOLN
Freq: Once | INTRAVENOUS | Status: AC
  Administered 2014-04-27: 14:00:00 via INTRAVENOUS

## 2014-04-27 MED ORDER — ONDANSETRON 8 MG/50ML IVPB (CHCC)
8.0000 mg | Freq: Once | INTRAVENOUS | Status: AC
  Administered 2014-04-27: 8 mg via INTRAVENOUS

## 2014-04-27 MED ORDER — ONDANSETRON 8 MG/NS 50 ML IVPB
INTRAVENOUS | Status: AC
  Filled 2014-04-27: qty 8

## 2014-04-27 MED ORDER — SODIUM CHLORIDE 0.9 % IJ SOLN
10.0000 mL | INTRAMUSCULAR | Status: AC | PRN
  Administered 2014-04-27: 10 mL via INTRAVENOUS
  Filled 2014-04-27: qty 10

## 2014-04-27 MED ORDER — HEPARIN SOD (PORK) LOCK FLUSH 100 UNIT/ML IV SOLN
500.0000 [IU] | Freq: Once | INTRAVENOUS | Status: AC
  Administered 2014-04-27: 500 [IU] via INTRAVENOUS
  Filled 2014-04-27: qty 5

## 2014-04-27 MED ORDER — LORAZEPAM 2 MG/ML IJ SOLN
0.5000 mg | Freq: Once | INTRAMUSCULAR | Status: AC
  Administered 2014-04-27: 0.5 mg via INTRAVENOUS

## 2014-04-27 MED ORDER — LORAZEPAM 2 MG/ML IJ SOLN
INTRAMUSCULAR | Status: AC
  Filled 2014-04-27: qty 1

## 2014-04-27 MED ORDER — SODIUM CHLORIDE 0.9 % IJ SOLN
10.0000 mL | INTRAMUSCULAR | Status: DC | PRN
  Administered 2014-04-27: 10 mL
  Filled 2014-04-27: qty 10

## 2014-04-27 MED ORDER — LORAZEPAM 0.5 MG PO TABS: 0.5000 mg | ORAL_TABLET | Freq: Four times a day (QID) | ORAL | Status: DC | PRN

## 2014-04-27 MED ORDER — HEPARIN SOD (PORK) LOCK FLUSH 100 UNIT/ML IV SOLN
500.0000 [IU] | Freq: Once | INTRAVENOUS | Status: AC | PRN
  Administered 2014-04-27: 500 [IU]
  Filled 2014-04-27: qty 5

## 2014-04-27 MED ORDER — SODIUM CHLORIDE 0.9 % IV SOLN: Freq: Once | INTRAVENOUS | Status: DC

## 2014-04-27 NOTE — Assessment & Plan Note (Signed)
Weakness is most likely associated with both chemotherapy and dehydration.  Patient was encouraged to push fluids is much as possible; and to remain as active as possible

## 2014-04-27 NOTE — Assessment & Plan Note (Signed)
Patient states that she has been vomiting intermittently since yesterday late afternoon.  She has been taking Compazine on an as-needed basis; with only minimal effect.  Patient was given both Zofran and lorazepam IV while in the infusion Center today.  This did seem to help and resolve her nausea/vomiting.  Patient was given a prescription for lorazepam to use on an as-needed basis for nausea at home.

## 2014-04-27 NOTE — Assessment & Plan Note (Signed)
Patient initiated Folfirinox chemotherapy on Wednesday, 04/25/2014.  She had a chemotherapy pump discontinued here at the [facility] today 04/27/2014.  Patient states that she did experience some diarrhea; and some moderate nausea and vomiting within the past 24 hours.  Cycle 2 of her chemotherapy will be due 05/10/2014.

## 2014-04-27 NOTE — Assessment & Plan Note (Signed)
Patient is complaining of increasing fatigue and weakness.  This is most likely chemotherapy related.  Patient was encouraged to remain as active as possible.

## 2014-04-27 NOTE — Telephone Encounter (Addendum)
Call received from scheduling that patient is not feeling well after pump d/c.  "Nausea" is only complaint reported to this nurse.  Asked for walk in form and to have patient wait in lobby for triage nurse.  Second call with in minutes.  Patient is in floor.  Selena Lesser NP notified.  P.O.F. Generated and scheduler to get wheelchair.

## 2014-04-27 NOTE — Progress Notes (Signed)
Ashley Perry   Chief Complaint  Patient presents with  . Dehydration    HPI: Ashley Perry 68 y.o. female diagnosed with the the cancer.  She initiated Folfirinox  chemotherapy Wednesday, 04/25/2014.  She arrived at the [facility] today for discontinuation of her chemotherapy pump.  Patient complained of some mild diarrhea since initiation of her chemotherapy.  She also states that she began vomiting yesterday late afternoon; and is more fatigued and weak today.  Patient was actually feeling out the walk-in form to be seen in symptom management clinic today; and felt so weak that she instead laid on floor of scheduling area.  She denies any recent fever or chills.    HPI  CURRENT THERAPY: Upcoming Treatment Dates - PANCREAS FOLFIRINOX q14d Days with orders from any treatment category:  05/10/2014      SCHEDULING COMMUNICATION      ondansetron (ZOFRAN) IVPB 16 mg      Dexamethasone Sodium Phosphate (DECADRON) injection 20 mg      oxaliplatin (ELOXATIN) 135 mg in dextrose 5 % 500 mL chemo infusion      leucovorin 644 mg in dextrose 5 % 250 mL infusion      irinotecan (CAMPTOSAR) 290 mg in dextrose 5 % 500 mL chemo infusion      fluorouracil (ADRUCIL) 3,850 mg in sodium chloride 0.9 % 150 mL chemo infusion      atropine injection 0.5 mg      sodium chloride 0.9 % injection 10 mL      heparin lock flush 100 unit/mL      heparin lock flush 100 unit/mL      alteplase (CATHFLO ACTIVASE) injection 2 mg      sodium chloride 0.9 % injection 3 mL      Hot Pack 1 packet      Cold Pack 1 packet      dextrose 5 % solution      TREATMENT CONDITIONS 05/12/2014      SCHEDULING COMMUNICATION      sodium chloride 0.9 % injection 10 mL      heparin lock flush 100 unit/mL      heparin lock flush 100 unit/mL      alteplase (CATHFLO ACTIVASE) injection 2 mg      sodium chloride 0.9 % injection 3 mL 05/24/2014      SCHEDULING COMMUNICATION      ondansetron (ZOFRAN) IVPB 16 mg     Dexamethasone Sodium Phosphate (DECADRON) injection 20 mg      oxaliplatin (ELOXATIN) 135 mg in dextrose 5 % 500 mL chemo infusion      leucovorin 644 mg in dextrose 5 % 250 mL infusion      irinotecan (CAMPTOSAR) 290 mg in dextrose 5 % 500 mL chemo infusion      fluorouracil (ADRUCIL) 3,850 mg in sodium chloride 0.9 % 150 mL chemo infusion      atropine injection 0.5 mg      sodium chloride 0.9 % injection 10 mL      heparin lock flush 100 unit/mL      heparin lock flush 100 unit/mL      alteplase (CATHFLO ACTIVASE) injection 2 mg      sodium chloride 0.9 % injection 3 mL      Hot Pack 1 packet      Cold Pack 1 packet      dextrose 5 % solution      TREATMENT CONDITIONS    Review of  Systems  Constitutional: Positive for malaise/fatigue. Negative for fever and chills.  Gastrointestinal: Positive for nausea, vomiting and diarrhea.  Neurological: Positive for weakness.  All other systems reviewed and are negative.   Past Medical History  Diagnosis Date  . GERD (gastroesophageal reflux disease)   . Ulcer     in college  . Skin cancer   . Asthma     CHILDHOOD ASTHMA  . Nausea LAST 10 DAYS    Past Surgical History  Procedure Laterality Date  . C sections      X 2  . Abdominal hysterectomy    . Back surgery  50YRS AGO    L 5 DISCECTOMY  . Eus N/A 03/29/2014    Procedure: UPPER ENDOSCOPIC ULTRASOUND (EUS) LINEAR;  Surgeon: Milus Banister, MD;  Location: [facility];  Service: Endoscopy;  Laterality: N/A;  . Endoscopic retrograde cholangiopancreatography (ercp) with propofol N/A 03/29/2014    Procedure: ENDOSCOPIC RETROGRADE CHOLANGIOPANCREATOGRAPHY (ERCP) WITH PROPOFOL;  Surgeon: Milus Banister, MD;  Location: [facility];  Service: Endoscopy;  Laterality: N/A;    has Pancreatic cancer; Obstructive jaundice; Fatigue; Weakness; Nausea with vomiting; and Dehydration on her problem list.     has No Known Allergies.    Medication List       This list is accurate as  of: 04/27/14  6:28 PM.  Always use your most recent med list.               lidocaine-prilocaine cream  Commonly known as:  EMLA  Apply 1 application topically as needed. Apply to PAC 1-2 hours prior to stick and cover with plastic wrap     lipase/protease/amylase 12000 UNITS Cpep capsule  Commonly known as:  CREON-12/PANCREASE  Take 2 capsules by mouth 3 (three) times daily before meals.     LORazepam 0.5 MG tablet  Commonly known as:  ATIVAN  Take 1 tablet (0.5 mg total) by mouth every 6 (six) hours as needed (PRN nausea).     omeprazole 20 MG capsule  Commonly known as:  PRILOSEC  Take 20 mg by mouth daily.     prochlorperazine 10 MG tablet  Commonly known as:  COMPAZINE  Take 1 tablet (10 mg total) by mouth every 6 (six) hours as needed for nausea.         PHYSICAL EXAMINATION  Vitals:  BP 108/67 HR 76, temp 97.6  Physical Exam  Nursing note and vitals reviewed. Constitutional: She is oriented to person, place, and time. Vital signs are normal. She appears malnourished and dehydrated. She appears unhealthy.  Cardiovascular: Regular rhythm and normal heart sounds.   Pulmonary/Chest: No respiratory distress.  Neurological: She is alert and oriented to person, place, and time.   ASSESSMENT/PLAN:    Pancreatic cancer  Assessment & Plan Patient initiated Folfirinox chemotherapy on Wednesday, 04/25/2014.  She had a chemotherapy pump discontinued here at the [facility] today 04/27/2014.  Patient states that she did experience some diarrhea; and some moderate nausea and vomiting within the past 24 hours.  Cycle 2 of her chemotherapy will be due 05/10/2014.   Fatigue  Assessment & Plan Patient is complaining of increasing fatigue and weakness.  This is most likely chemotherapy related.  Patient was encouraged to remain as active as possible.   Weakness  Assessment & Plan Weakness is most likely associated with both chemotherapy and dehydration.  Patient was  encouraged to push fluids is much as possible; and to remain as active as possible   Nausea  with vomiting  Assessment & Plan Patient states that she has been vomiting intermittently since yesterday late afternoon.  She has been taking Compazine on an as-needed basis; with only minimal effect.  Patient was given both Zofran and lorazepam IV while in the infusion Center today.  This did seem to help and resolve her nausea/vomiting.  Patient was given a prescription for lorazepam to use on an as-needed basis for nausea at home.   Dehydration  Assessment & Plan Patient has had some mild diarrhea; and has been vomiting for the past 24 hours.  She did appear mildly dehydrated on exam.  Patient did receive a 1 L normal saline IV fluid rehydration while in the infusion Center today.  Discussed with the patient the need for further IV fluid rehydration over the weekend.  Have placed orders for IV fluid rehydration tomorrow if the patient so desires.   Patient stated understanding of all instructions; and was in agreement with this plan of care. The patient knows to call the clinic with any problems, questions or concerns.   Will review all with Dr. Benay Spice regarding aspects of patient's visit today.   Total time spent with patient was 25 minutes;  with greater than 75 percent of that time spent in face to face counseling regarding her symptoms, monitoring while in infusion area, and coordination of care and follow up.  Disclaimer: This note was dictated with voice recognition software. Similar sounding words can inadvertently be transcribed and may not be corrected upon review.   Drue Second, NP 04/27/2014

## 2014-04-27 NOTE — Patient Instructions (Signed)
Dehydration, Adult Dehydration is when you lose more fluids from the body than you take in. Vital organs like the kidneys, brain, and heart cannot function without a proper amount of fluids and salt. Any loss of fluids from the body can cause dehydration.  CAUSES   Vomiting.  Diarrhea.  Excessive sweating.  Excessive urine output.  Fever. SYMPTOMS  Mild dehydration  Thirst.  Dry lips.  Slightly dry mouth. Moderate dehydration  Very dry mouth.  Sunken eyes.  Skin does not bounce back quickly when lightly pinched and released.  Dark urine and decreased urine production.  Decreased tear production.  Headache. Severe dehydration  Very dry mouth.  Extreme thirst.  Rapid, weak pulse (more than 100 beats per minute at rest).  Cold hands and feet.  Not able to sweat in spite of heat and temperature.  Rapid breathing.  Blue lips.  Confusion and lethargy.  Difficulty being awakened.  Minimal urine production.  No tears. DIAGNOSIS  Your caregiver will diagnose dehydration based on your symptoms and your exam. Blood and urine tests will help confirm the diagnosis. The diagnostic evaluation should also identify the cause of dehydration. TREATMENT  Treatment of mild or moderate dehydration can often be done at home by increasing the amount of fluids that you drink. It is best to drink small amounts of fluid more often. Drinking too much at one time can make vomiting worse. Refer to the home care instructions below. Severe dehydration needs to be treated at the hospital where you will probably be given intravenous (IV) fluids that contain water and electrolytes. HOME CARE INSTRUCTIONS   Ask your caregiver about specific rehydration instructions.  Drink enough fluids to keep your urine clear or pale yellow.  Drink small amounts frequently if you have nausea and vomiting.  Eat as you normally do.  Avoid:  Foods or drinks high in sugar.  Carbonated  drinks.  Juice.  Extremely hot or cold fluids.  Drinks with caffeine.  Fatty, greasy foods.  Alcohol.  Tobacco.  Overeating.  Gelatin desserts.  Wash your hands well to avoid spreading bacteria and viruses.  Only take over-the-counter or prescription medicines for pain, discomfort, or fever as directed by your caregiver.  Ask your caregiver if you should continue all prescribed and over-the-counter medicines.  Keep all follow-up appointments with your caregiver. SEEK MEDICAL CARE IF:  You have abdominal pain and it increases or stays in one area (localizes).  You have a rash, stiff neck, or severe headache.  You are irritable, sleepy, or difficult to awaken.  You are weak, dizzy, or extremely thirsty. SEEK IMMEDIATE MEDICAL CARE IF:   You are unable to keep fluids down or you get worse despite treatment.  You have frequent episodes of vomiting or diarrhea.  You have blood or green matter (bile) in your vomit.  You have blood in your stool or your stool looks black and tarry.  You have not urinated in 6 to 8 hours, or you have only urinated a small amount of very dark urine.  You have a fever.  You faint. MAKE SURE YOU:   Understand these instructions.  Will watch your condition.  Will get help right away if you are not doing well or get worse. Document Released: 09/21/2005 Document Revised: 12/14/2011 Document Reviewed: 05/11/2011 ExitCare Patient Information 2015 [employer]. This information is not intended to replace advice given to you by your health care provider. Make sure you discuss any questions you have with your health care   provider.  

## 2014-04-27 NOTE — Assessment & Plan Note (Signed)
Patient has had some mild diarrhea; and has been vomiting for the past 24 hours.  She did appear mildly dehydrated on exam.  Patient did receive a 1 L normal saline IV fluid rehydration while in the infusion Center today.  Discussed with the patient the need for further IV fluid rehydration over the weekend.  Have placed orders for IV fluid rehydration tomorrow if the patient so desires.

## 2014-04-28 ENCOUNTER — Ambulatory Visit: Payer: Commercial Managed Care - PPO

## 2014-04-30 ENCOUNTER — Telehealth: Payer: Self-pay | Admitting: *Deleted

## 2014-04-30 ENCOUNTER — Other Ambulatory Visit (HOSPITAL_COMMUNITY): Payer: Commercial Managed Care - PPO

## 2014-04-30 DIAGNOSIS — C259 Malignant neoplasm of pancreas, unspecified: Secondary | ICD-10-CM

## 2014-04-30 MED ORDER — MAGIC MOUTHWASH: 5.0000 mL | Freq: Four times a day (QID) | ORAL | Status: AC | PRN

## 2014-04-30 NOTE — Telephone Encounter (Signed)
Left VM that she has mouth sores. Spoke with patient and she has cracked corners of her mouth-encouraged her to apply vasoline frequently. Gums and tongue are red and tender.  Rinse mouth several times daily with baking soda 1/4 tsp and 8 ounces water. Will call in magic mouthwash to swish and spit. Avoid acidic and rough foods/liquids. Call back if not better. She plans to go out of town Thursday and is requesting her counts be checked on Wednesday.

## 2014-04-30 NOTE — Progress Notes (Addendum)
Collaborative nurse spoke with patient today (04-30-2014).  See phone note.  Mucositis and new order for MMW.  For labs on 05-02-2014.  Patient did not come in on 04-28-2014 for further IVF.  Weekend nurse called and reached voicemail.  No second call made by this nurse as she has been assessed earlier today.

## 2014-05-02 ENCOUNTER — Telehealth: Payer: Self-pay | Admitting: *Deleted

## 2014-05-02 ENCOUNTER — Other Ambulatory Visit (HOSPITAL_BASED_OUTPATIENT_CLINIC_OR_DEPARTMENT_OTHER): Payer: Medicare Other

## 2014-05-02 DIAGNOSIS — C259 Malignant neoplasm of pancreas, unspecified: Secondary | ICD-10-CM

## 2014-05-02 DIAGNOSIS — C250 Malignant neoplasm of head of pancreas: Secondary | ICD-10-CM

## 2014-05-02 LAB — CBC WITH DIFFERENTIAL/PLATELET
BASO%: 0.3 % (ref 0.0–2.0)
Basophils Absolute: 0 10*3/uL (ref 0.0–0.1)
EOS%: 6.8 % (ref 0.0–7.0)
Eosinophils Absolute: 0.4 10*3/uL (ref 0.0–0.5)
HCT: 34.4 % — ABNORMAL LOW (ref 34.8–46.6)
HEMOGLOBIN: 11.5 g/dL — AB (ref 11.6–15.9)
LYMPH%: 21.6 % (ref 14.0–49.7)
MCH: 28.9 pg (ref 25.1–34.0)
MCHC: 33.5 g/dL (ref 31.5–36.0)
MCV: 86.3 fL (ref 79.5–101.0)
MONO#: 0.3 10*3/uL (ref 0.1–0.9)
MONO%: 5.8 % (ref 0.0–14.0)
NEUT#: 3.8 10*3/uL (ref 1.5–6.5)
NEUT%: 65.5 % (ref 38.4–76.8)
Platelets: 195 10*3/uL (ref 145–400)
RBC: 3.99 10*6/uL (ref 3.70–5.45)
RDW: 12.1 % (ref 11.2–14.5)
WBC: 5.8 10*3/uL (ref 3.9–10.3)
lymph#: 1.2 10*3/uL (ref 0.9–3.3)

## 2014-05-02 NOTE — Telephone Encounter (Addendum)
Message from pt reporting she has cried more yesterday than she has since diagnosis. Reports feelings of depression and feeling "flat". Also feels jittery and irritable. Wide awake at night. Wonders if Decadron pre-med could be causing these symptoms. Told her it is not likely one week after treatment, recommended she try her Ativan at bedtime to see if this helps her rest. She voiced understanding. Pt states she just wants Dr. Benay Spice aware of this prior to next visit.  Dr. Benay Spice made aware of call, will address at office visit. Pt understands to call office if feelings of depression worsen, if she develops any SI or HI. Pt reports today was a better day, has been out all day.

## 2014-05-03 ENCOUNTER — Encounter: Payer: Commercial Managed Care - PPO | Admitting: Nutrition

## 2014-05-03 ENCOUNTER — Other Ambulatory Visit: Payer: Commercial Managed Care - PPO

## 2014-05-06 ENCOUNTER — Other Ambulatory Visit: Payer: Self-pay | Admitting: Oncology

## 2014-05-08 ENCOUNTER — Telehealth: Payer: Self-pay | Admitting: *Deleted

## 2014-05-08 NOTE — Telephone Encounter (Signed)
Message from pt's daughter reporting T 101.8 today with chills.

## 2014-05-08 NOTE — Telephone Encounter (Signed)
Spoke with pt's daughter, Vernona Rieger. Instructed her to take pt to ED in [place]. Recommended she do not drive the 1.5 hr to bring to [facility] ED. Have MD call this office with any questions. She voiced understanding. She also reports pt is having abdominal pain, last BM 8/2. Told her fever/ shaking chills is an emergency until we know her blood counts are OK. Can deal with pain/ constipation after she is seen in ED.

## 2014-05-08 NOTE — Telephone Encounter (Signed)
Call from Dr. Louretta Parma [facility]) reporting pt's WBC 2.16, 43% segmented neutrophils and 1% bands. ANC calculated to 936. Per Dr. Louretta Parma, pt will be admitted for antibiotics. Ultrasound showed only stones in gallbladder- no blockages. Note to Dr. Benay Spice.

## 2014-05-08 NOTE — Telephone Encounter (Signed)
Received call from daughter stating that they are out of town now - about 1 1/2 hours away.   Pt awoke this am feeling of gas pain; pt has not had bowel movements in 2 days.  Is eating and drinking fine.  However, now pt developed shaking chills, denied nausea or vomiting, has temp of  101.8.  Daughter wanted to know what pt should do.  Daughter stated she could bring pt back to the clinic in approx 1 1/2 hours. Daughter stated pt had a very good day yesterday.  Message to Dr. Benay Spice. Daughter's phone   [phone].

## 2014-05-09 ENCOUNTER — Telehealth: Payer: Self-pay | Admitting: *Deleted

## 2014-05-09 NOTE — Telephone Encounter (Signed)
Message from pt's daughter reporting she will not be discharged from [facility] today, needs follow up appt. Reviewed with Dr. Benay Spice: Lab/ office 8/10. Chemo 8/11. Orders entered. Spoke with pt, she reports she feels great, would like Dr. Benay Spice to speak to the doctor there to be sure she goes home tomorrow.  Pt requesting to be set up for IV fluids at [facility] after each treatment since she had such a hard time with side effects after the first cycle. Stated her children are a great help but she doesn't want to burden them. Is a member at [employer] and they are able to accommodate her as long as they have advanced notice. Will review with Dr. Benay Spice for orders. Will schedule for IV fluids here day of pump DC.

## 2014-05-09 NOTE — Telephone Encounter (Signed)
Per [facility] staff phone call scheduled appts. Advised schedulers 

## 2014-05-10 ENCOUNTER — Telehealth: Payer: Self-pay | Admitting: *Deleted

## 2014-05-10 ENCOUNTER — Encounter: Payer: Commercial Managed Care - PPO | Admitting: Nutrition

## 2014-05-10 ENCOUNTER — Ambulatory Visit: Payer: Commercial Managed Care - PPO | Admitting: Oncology

## 2014-05-10 ENCOUNTER — Ambulatory Visit: Payer: Commercial Managed Care - PPO

## 2014-05-10 ENCOUNTER — Other Ambulatory Visit: Payer: Commercial Managed Care - PPO

## 2014-05-10 NOTE — Telephone Encounter (Signed)
Call from [place] at [facility], pt was discharged today. Requesting follow up. Informed her of pt's next appts. Requested Lab/Discharge summary to be faxed to office.

## 2014-05-11 ENCOUNTER — Telehealth: Payer: Self-pay | Admitting: Oncology

## 2014-05-14 ENCOUNTER — Other Ambulatory Visit: Payer: Commercial Managed Care - PPO

## 2014-05-14 ENCOUNTER — Other Ambulatory Visit (HOSPITAL_BASED_OUTPATIENT_CLINIC_OR_DEPARTMENT_OTHER): Payer: Commercial Managed Care - PPO

## 2014-05-14 ENCOUNTER — Telehealth: Payer: Self-pay | Admitting: Oncology

## 2014-05-14 ENCOUNTER — Ambulatory Visit (HOSPITAL_BASED_OUTPATIENT_CLINIC_OR_DEPARTMENT_OTHER): Payer: Commercial Managed Care - PPO | Admitting: Oncology

## 2014-05-14 ENCOUNTER — Other Ambulatory Visit: Payer: Self-pay | Admitting: *Deleted

## 2014-05-14 ENCOUNTER — Encounter: Payer: Self-pay | Admitting: Pharmacist

## 2014-05-14 VITALS — BP 126/57 | HR 61 | Temp 98.6°F | Resp 18 | Ht 66.0 in | Wt 121.5 lb

## 2014-05-14 DIAGNOSIS — C259 Malignant neoplasm of pancreas, unspecified: Secondary | ICD-10-CM

## 2014-05-14 DIAGNOSIS — C787 Secondary malignant neoplasm of liver and intrahepatic bile duct: Secondary | ICD-10-CM

## 2014-05-14 DIAGNOSIS — C250 Malignant neoplasm of head of pancreas: Secondary | ICD-10-CM

## 2014-05-14 LAB — COMPREHENSIVE METABOLIC PANEL
ALBUMIN: 3.4 g/dL — AB (ref 3.5–5.2)
ALK PHOS: 476 U/L — AB (ref 39–117)
ALT: 78 U/L — AB (ref 0–35)
AST: 66 U/L — AB (ref 0–37)
BUN: 10 mg/dL (ref 6–23)
CO2: 26 mEq/L (ref 19–32)
Calcium: 9.4 mg/dL (ref 8.4–10.5)
Chloride: 98 mEq/L (ref 96–112)
Creatinine, Ser: 0.63 mg/dL (ref 0.50–1.10)
Glucose, Bld: 130 mg/dL — ABNORMAL HIGH (ref 70–99)
POTASSIUM: 4.1 meq/L (ref 3.5–5.3)
SODIUM: 136 meq/L (ref 135–145)
Total Bilirubin: 0.3 mg/dL (ref 0.2–1.2)
Total Protein: 6.8 g/dL (ref 6.0–8.3)

## 2014-05-14 LAB — CBC WITH DIFFERENTIAL/PLATELET
BASO%: 2.3 % — ABNORMAL HIGH (ref 0.0–2.0)
Basophils Absolute: 0.2 10*3/uL — ABNORMAL HIGH (ref 0.0–0.1)
EOS%: 7.6 % — AB (ref 0.0–7.0)
Eosinophils Absolute: 0.5 10*3/uL (ref 0.0–0.5)
HCT: 33 % — ABNORMAL LOW (ref 34.8–46.6)
HGB: 10.8 g/dL — ABNORMAL LOW (ref 11.6–15.9)
LYMPH%: 23.6 % (ref 14.0–49.7)
MCH: 28.5 pg (ref 25.1–34.0)
MCHC: 32.7 g/dL (ref 31.5–36.0)
MCV: 87.4 fL (ref 79.5–101.0)
MONO#: 0.9 10*3/uL (ref 0.1–0.9)
MONO%: 13 % (ref 0.0–14.0)
NEUT#: 3.7 10*3/uL (ref 1.5–6.5)
NEUT%: 53.5 % (ref 38.4–76.8)
Platelets: 268 10*3/uL (ref 145–400)
RBC: 3.78 10*6/uL (ref 3.70–5.45)
RDW: 13 % (ref 11.2–14.5)
WBC: 7 10*3/uL (ref 3.9–10.3)
lymph#: 1.6 10*3/uL (ref 0.9–3.3)

## 2014-05-14 MED ORDER — ONDANSETRON HCL 8 MG PO TABS: 8.0000 mg | ORAL_TABLET | Freq: Two times a day (BID) | ORAL | Status: DC

## 2014-05-14 MED ORDER — DEXAMETHASONE 4 MG PO TABS: 8.0000 mg | ORAL_TABLET | Freq: Two times a day (BID) | ORAL | Status: AC

## 2014-05-14 NOTE — Progress Notes (Signed)
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 Reports they will administer IV fluids and antiemetics if necessary. She would have to pay out of pocket for this, but feels it would be worth it for her. She will discuss this with physician. Requesting script for Zofran for home use and does not want to take Compazine again due to how it "made me feel".

## 2014-05-14 NOTE — Telephone Encounter (Signed)
[facility] APPT SCHEDULE FOR AUG

## 2014-05-14 NOTE — Progress Notes (Signed)
[facility] OFFICE PROGRESS NOTE   Diagnosis: Pancreas cancer  INTERVAL HISTORY:   Ms. Thaden returns as scheduled. She completed a first cycle FOLFIRINOX on 04/25/2014. She had diarrhea while in the chemotherapy infusion Center. No further diarrhea. On the evening of day 2 she developed nausea and vomiting and this persisted for approximately next 5 days. She received additional anti-emetics when she was here for the pump disconnect on 04/27/2014. She reports feeling well after approximately day 7. [place] and was admitted on 05/08/2014 with a fever. No associated symptoms. She was neutropenic in the liver enzymes was elevated. She was admitted to the hospital and received IV antibiotics for the next 2 days. The fever resolved and she was discharged 05/10/2014 (we do not have a discharge summary available today).  Ms. Slutsky now feels well. Good appetite and energy level. No pain. She had cold sensitivity following chemotherapy and reports numbness in a few of the left toes. She had mouth sores following chemotherapy. These have resolved and were relieved with magic mouthwash. She was able to eat and drink.  Objective:  Vital signs in last 24 hours:  Blood pressure 126/57, pulse 61, temperature 98.6 F (37 C), temperature source Oral, resp. rate 18, height 5\' 6"  (1.676 m), weight 121 lb 8 oz (55.112 kg), SpO2 100.00%.    HEENT: No thrush or ulcers Resp: Lungs clear bilaterally Cardio: Regular rate and rhythm GI: No hepatomegaly, nontender, no mass Vascular: No leg edema  Skin: Palms without erythema   Portacath/PICC-without erythema  Lab Results:  Lab Results  Component Value Date   WBC 7.0 05/14/2014   HGB 10.8* 05/14/2014   HCT 33.0* 05/14/2014   MCV 87.4 05/14/2014   PLT 268 05/14/2014   NEUTROABS 3.7 05/14/2014   Potassium 4.1, creatinine 0.63, alkaline phosphatase 476, AST 66, ALT 78, bilirubin 0.3   Imaging:  No results  found.  Medications: I have reviewed the patient's current medications.  Assessment/Plan: 1. 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 obstructive jaundice secondary to #1, status post placement of a bile duct stent 03/29/2014  3. delayed nausea and vomiting following cycle one FOLFIRINOX 4. admission with febrile neutropenia 05/08/2014-we will followup on the discharge summary and culture results 5. Elevated liver enzymes on hospital admission 05/08/2014, improved   Disposition:  Ms. Cinque completed one cycle of FOLFIRINOX. Chemotherapy was complicated by delayed nausea and vomiting and an admission on day 14 with febrile neutropenia. There is no apparent source for infection upon review of her history, though she has an indwelling stent and the liver enzymes were elevated. We will followup on the culture results from [facility].  The plan is to proceed with cycle 2 FOLFIRINOX: 05/15/2014. We will adjust the anti-emetic premedication with this cycle and she will take prophylactic Decadron. She will also receive Neulasta support with this cycle of chemotherapy. We reviewed the potential toxicities associated with Neulasta and she agrees to proceed.  Ms. Fialkowski will return for an office visit and cycle 3 FOLFIRINOX on 05/29/14.  Betsy Coder,  MD  05/14/2014  1:33 PM

## 2014-05-15 ENCOUNTER — Encounter: Payer: Commercial Managed Care - PPO | Admitting: Nutrition

## 2014-05-15 ENCOUNTER — Encounter: Payer: Self-pay | Admitting: *Deleted

## 2014-05-15 ENCOUNTER — Encounter: Payer: Self-pay | Admitting: Oncology

## 2014-05-15 ENCOUNTER — Ambulatory Visit (HOSPITAL_BASED_OUTPATIENT_CLINIC_OR_DEPARTMENT_OTHER): Payer: Commercial Managed Care - PPO

## 2014-05-15 ENCOUNTER — Ambulatory Visit: Payer: Commercial Managed Care - PPO | Admitting: Nutrition

## 2014-05-15 VITALS — BP 133/64 | HR 59 | Temp 97.9°F | Resp 17

## 2014-05-15 DIAGNOSIS — C787 Secondary malignant neoplasm of liver and intrahepatic bile duct: Secondary | ICD-10-CM

## 2014-05-15 DIAGNOSIS — C259 Malignant neoplasm of pancreas, unspecified: Secondary | ICD-10-CM

## 2014-05-15 DIAGNOSIS — Z5111 Encounter for antineoplastic chemotherapy: Secondary | ICD-10-CM

## 2014-05-15 DIAGNOSIS — C250 Malignant neoplasm of head of pancreas: Secondary | ICD-10-CM

## 2014-05-15 MED ORDER — DEXAMETHASONE SODIUM PHOSPHATE 20 MG/5ML IJ SOLN
12.0000 mg | Freq: Once | INTRAMUSCULAR | Status: AC
  Administered 2014-05-15: 12 mg via INTRAVENOUS

## 2014-05-15 MED ORDER — FLUOROURACIL CHEMO INJECTION 5 GM/100ML
1920.0000 mg/m2 | INTRAVENOUS | Status: DC
  Administered 2014-05-15: 3100 mg via INTRAVENOUS
  Filled 2014-05-15: qty 62

## 2014-05-15 MED ORDER — DEXAMETHASONE SODIUM PHOSPHATE 20 MG/5ML IJ SOLN
INTRAMUSCULAR | Status: AC
  Filled 2014-05-15: qty 5

## 2014-05-15 MED ORDER — IRINOTECAN HCL CHEMO INJECTION 100 MG/5ML
140.0000 mg/m2 | Freq: Once | INTRAVENOUS | Status: AC
  Administered 2014-05-15: 226 mg via INTRAVENOUS
  Filled 2014-05-15: qty 11.3

## 2014-05-15 MED ORDER — PALONOSETRON HCL INJECTION 0.25 MG/5ML
0.2500 mg | Freq: Once | INTRAVENOUS | Status: AC
  Administered 2014-05-15: 0.25 mg via INTRAVENOUS

## 2014-05-15 MED ORDER — PALONOSETRON HCL INJECTION 0.25 MG/5ML
INTRAVENOUS | Status: AC
  Filled 2014-05-15: qty 5

## 2014-05-15 MED ORDER — DEXTROSE 5 % IV SOLN
Freq: Once | INTRAVENOUS | Status: AC
  Administered 2014-05-15: 12:00:00 via INTRAVENOUS

## 2014-05-15 MED ORDER — SODIUM CHLORIDE 0.9 % IV SOLN
Freq: Once | INTRAVENOUS | Status: AC
  Administered 2014-05-15: 10:00:00 via INTRAVENOUS

## 2014-05-15 MED ORDER — FOSAPREPITANT DIMEGLUMINE INJECTION 150 MG
150.0000 mg | Freq: Once | INTRAVENOUS | Status: AC
  Administered 2014-05-15: 150 mg via INTRAVENOUS
  Filled 2014-05-15: qty 5

## 2014-05-15 MED ORDER — ATROPINE SULFATE 1 MG/ML IJ SOLN
0.5000 mg | Freq: Once | INTRAMUSCULAR | Status: AC | PRN
Start: 2014-05-15 — End: 2014-05-15
  Administered 2014-05-15: 0.5 mg via INTRAVENOUS

## 2014-05-15 MED ORDER — DEXTROSE 5 % IV SOLN
400.0000 mg/m2 | Freq: Once | INTRAVENOUS | Status: AC
  Administered 2014-05-15: 644 mg via INTRAVENOUS
  Filled 2014-05-15: qty 32.2

## 2014-05-15 MED ORDER — ATROPINE SULFATE 1 MG/ML IJ SOLN
INTRAMUSCULAR | Status: AC
  Filled 2014-05-15: qty 1

## 2014-05-15 MED ORDER — OXALIPLATIN CHEMO INJECTION 100 MG/20ML
85.0000 mg/m2 | Freq: Once | INTRAVENOUS | Status: AC
  Administered 2014-05-15: 135 mg via INTRAVENOUS
  Filled 2014-05-15: qty 27

## 2014-05-15 NOTE — Progress Notes (Signed)
Message from nurse at [facility] they needed copy of medicare card. I called Ashley Perry and advised no medicare just the [facility]. She apoligized for call no medicare, just [facility].

## 2014-05-15 NOTE — Patient Instructions (Addendum)
[facility] Discharge Instructions for Patients Receiving Chemotherapy  Today you received the following chemotherapy agents: Irinotecan, Leucovorin, Oxaliplatin, Adrucil (5FU)  To help prevent nausea and vomiting after your treatment, we encourage you to take your nausea medication as prescribed by your physician: Ativan 0.5 mg as needed every 6 hrs as needed; Zofran 8 mg every 8 hrs as needed.    If you develop nausea and vomiting that is not controlled by your nausea medication, call the clinic.   BELOW ARE SYMPTOMS THAT SHOULD BE REPORTED IMMEDIATELY:  *FEVER GREATER THAN 100.5 F  *CHILLS WITH OR WITHOUT FEVER  NAUSEA AND VOMITING THAT IS NOT CONTROLLED WITH YOUR NAUSEA MEDICATION  *UNUSUAL SHORTNESS OF BREATH  *UNUSUAL BRUISING OR BLEEDING  TENDERNESS IN MOUTH AND THROAT WITH OR WITHOUT PRESENCE OF ULCERS  *URINARY PROBLEMS  *BOWEL PROBLEMS  UNUSUAL RASH Items with * indicate a potential emergency and should be followed up as soon as possible.  Feel free to call the clinic you have any questions or concerns. The clinic phone number is [phone].

## 2014-05-15 NOTE — Progress Notes (Signed)
68 year old female diagnosed with cancer of the pancreas.  She is a patient of Dr. Benay Spice.  Past medical history includes GERD, nausea, ulcer, and asthma.  Medications include Prilosec, Magic mouthwash, Decadron, Creon, Ativan, and Zofran.  Labs include glucose of 130 and albumin 3.4 on August 10.  Height: 66 inches. Weight: 121.5 pounds August 10. Usual body weight: 126 pounds June 2015. BMI: 19.62.  Dietary history reveals patient consumes healthy diet with lots of protein, and fruits and vegetables.  Patient did not feel well after her first cycle of FOLFIRINOX.  Patient reports diarrhea and severe nausea and vomiting for 5 days or more after treatment.  Patient reports she could not eat during times of nausea and vomiting.  She developed mouth sores, but these are now improved.  Nutrition diagnosis: Unintended weight loss related to diagnosis of pancreas cancer and associated treatments as evidenced by no prior need for nutrition related information.  Intervention: Educated patient on strategies for increasing oral intake with nausea and vomiting. Reviewed strategies for eating with diarrhea. Provided recipes for warm oral nutrition supplements for patient to consume after oxaliplatin. Questions answered.  Teach back method used.  Contact information given.  Monitoring, evaluation, goals: Patient will tolerate increased calories and protein to minimize weight loss.  Next visit: Tuesday, August 25, during chemotherapy.  **Disclaimer: This note was dictated with voice recognition software. Similar sounding words can inadvertently be transcribed and this note may contain transcription errors which may not have been corrected upon publication of note.**

## 2014-05-15 NOTE — Progress Notes (Signed)
Faxed all orders, office note, labs and medication list to Candida Peeling, RN at [facility] rehab. She confirms receipt of orders. For patient to get IV Ativan if needed there, we need to call [facility] [phone] that supplies the nursing center meds. Called pharmacy with dosing 2 mg/ml vial (#4): give 0.5 mg IV every 6 hours if needed for nausea. She will enter rehab unit on 05/16/12 for fluids via peripheral route and then be discharged on 8/13 to come to [facility] for pump D/C and fluids can be given here if needed. Confirmed with Raya that she has her daughter picking up her Decadron script this afternoon. She reported today that thus far the treatment has already gone better-no diarrhea or nausea. Able to eat lunch.

## 2014-05-16 ENCOUNTER — Other Ambulatory Visit: Payer: Self-pay | Admitting: *Deleted

## 2014-05-16 ENCOUNTER — Ambulatory Visit: Payer: Commercial Managed Care - PPO

## 2014-05-16 MED ORDER — LORAZEPAM 0.5 MG PO TABS: ORAL_TABLET | ORAL | Status: DC

## 2014-05-16 NOTE — Telephone Encounter (Signed)
[facility] 

## 2014-05-17 ENCOUNTER — Other Ambulatory Visit: Payer: Self-pay | Admitting: *Deleted

## 2014-05-17 ENCOUNTER — Ambulatory Visit (HOSPITAL_BASED_OUTPATIENT_CLINIC_OR_DEPARTMENT_OTHER): Payer: Commercial Managed Care - PPO

## 2014-05-17 ENCOUNTER — Ambulatory Visit: Payer: Commercial Managed Care - PPO

## 2014-05-17 VITALS — BP 122/76 | Temp 97.9°F | Resp 18

## 2014-05-17 DIAGNOSIS — Z5189 Encounter for other specified aftercare: Secondary | ICD-10-CM

## 2014-05-17 DIAGNOSIS — R112 Nausea with vomiting, unspecified: Secondary | ICD-10-CM

## 2014-05-17 DIAGNOSIS — C250 Malignant neoplasm of head of pancreas: Secondary | ICD-10-CM

## 2014-05-17 DIAGNOSIS — C259 Malignant neoplasm of pancreas, unspecified: Secondary | ICD-10-CM

## 2014-05-17 MED ORDER — HEPARIN SOD (PORK) LOCK FLUSH 100 UNIT/ML IV SOLN
500.0000 [IU] | Freq: Once | INTRAVENOUS | Status: AC
  Administered 2014-05-17: 500 [IU] via INTRAVENOUS
  Filled 2014-05-17: qty 5

## 2014-05-17 MED ORDER — SODIUM CHLORIDE 0.9 % IJ SOLN
10.0000 mL | INTRAMUSCULAR | Status: DC | PRN
  Administered 2014-05-17: 10 mL via INTRAVENOUS
  Filled 2014-05-17: qty 10

## 2014-05-17 MED ORDER — SODIUM CHLORIDE 0.9 % IJ SOLN
10.0000 mL | INTRAMUSCULAR | Status: DC | PRN
  Filled 2014-05-17: qty 10

## 2014-05-17 MED ORDER — ONDANSETRON 8 MG/50ML IVPB (CHCC)
8.0000 mg | Freq: Once | INTRAVENOUS | Status: AC
  Administered 2014-05-17: 8 mg via INTRAVENOUS

## 2014-05-17 MED ORDER — PEGFILGRASTIM INJECTION 6 MG/0.6ML
6.0000 mg | Freq: Once | SUBCUTANEOUS | Status: AC
  Administered 2014-05-17: 6 mg via SUBCUTANEOUS
  Filled 2014-05-17: qty 0.6

## 2014-05-17 MED ORDER — ONDANSETRON 8 MG/NS 50 ML IVPB
INTRAVENOUS | Status: AC
  Filled 2014-05-17: qty 8

## 2014-05-17 MED ORDER — HEPARIN SOD (PORK) LOCK FLUSH 100 UNIT/ML IV SOLN
500.0000 [IU] | Freq: Once | INTRAVENOUS | Status: DC | PRN
  Filled 2014-05-17: qty 5

## 2014-05-17 MED ORDER — SODIUM CHLORIDE 0.9 % IV SOLN
1000.0000 mL | Freq: Once | INTRAVENOUS | Status: AC
  Administered 2014-05-17: 1000 mL via INTRAVENOUS

## 2014-05-17 NOTE — Patient Instructions (Signed)
Dehydration, Adult Dehydration is when you lose more fluids from the body than you take in. Vital organs like the kidneys, brain, and heart cannot function without a proper amount of fluids and salt. Any loss of fluids from the body can cause dehydration.  CAUSES   Vomiting.  Diarrhea.  Excessive sweating.  Excessive urine output.  Fever. SYMPTOMS  Mild dehydration  Thirst.  Dry lips.  Slightly dry mouth. Moderate dehydration  Very dry mouth.  Sunken eyes.  Skin does not bounce back quickly when lightly pinched and released.  Dark urine and decreased urine production.  Decreased tear production.  Headache. Severe dehydration  Very dry mouth.  Extreme thirst.  Rapid, weak pulse (more than 100 beats per minute at rest).  Cold hands and feet.  Not able to sweat in spite of heat and temperature.  Rapid breathing.  Blue lips.  Confusion and lethargy.  Difficulty being awakened.  Minimal urine production.  No tears. DIAGNOSIS  Your caregiver will diagnose dehydration based on your symptoms and your exam. Blood and urine tests will help confirm the diagnosis. The diagnostic evaluation should also identify the cause of dehydration. TREATMENT  Treatment of mild or moderate dehydration can often be done at home by increasing the amount of fluids that you drink. It is best to drink small amounts of fluid more often. Drinking too much at one time can make vomiting worse. Refer to the home care instructions below. Severe dehydration needs to be treated at the hospital where you will probably be given intravenous (IV) fluids that contain water and electrolytes. HOME CARE INSTRUCTIONS   Ask your caregiver about specific rehydration instructions.  Drink enough fluids to keep your urine clear or pale yellow.  Drink small amounts frequently if you have nausea and vomiting.  Eat as you normally do.  Avoid:  Foods or drinks high in sugar.  Carbonated  drinks.  Juice.  Extremely hot or cold fluids.  Drinks with caffeine.  Fatty, greasy foods.  Alcohol.  Tobacco.  Overeating.  Gelatin desserts.  Wash your hands well to avoid spreading bacteria and viruses.  Only take over-the-counter or prescription medicines for pain, discomfort, or fever as directed by your caregiver.  Ask your caregiver if you should continue all prescribed and over-the-counter medicines.  Keep all follow-up appointments with your caregiver. SEEK MEDICAL CARE IF:  You have abdominal pain and it increases or stays in one area (localizes).  You have a rash, stiff neck, or severe headache.  You are irritable, sleepy, or difficult to awaken.  You are weak, dizzy, or extremely thirsty. SEEK IMMEDIATE MEDICAL CARE IF:   You are unable to keep fluids down or you get worse despite treatment.  You have frequent episodes of vomiting or diarrhea.  You have blood or green matter (bile) in your vomit.  You have blood in your stool or your stool looks black and tarry.  You have not urinated in 6 to 8 hours, or you have only urinated a small amount of very dark urine.  You have a fever.  You faint. MAKE SURE YOU:   Understand these instructions.  Will watch your condition.  Will get help right away if you are not doing well or get worse. Document Released: 09/21/2005 Document Revised: 12/14/2011 Document Reviewed: 05/11/2011 ExitCare Patient Information 2015 [employer]. This information is not intended to replace advice given to you by your health care provider. Make sure you discuss any questions you have with your health care   provider.  

## 2014-05-22 ENCOUNTER — Encounter: Payer: Self-pay | Admitting: Oncology

## 2014-05-24 ENCOUNTER — Ambulatory Visit: Payer: Commercial Managed Care - PPO

## 2014-05-24 ENCOUNTER — Other Ambulatory Visit: Payer: Commercial Managed Care - PPO

## 2014-05-26 ENCOUNTER — Emergency Department (HOSPITAL_COMMUNITY): Payer: Commercial Managed Care - PPO

## 2014-05-26 ENCOUNTER — Encounter (HOSPITAL_COMMUNITY): Payer: Self-pay | Admitting: Emergency Medicine

## 2014-05-26 ENCOUNTER — Emergency Department (HOSPITAL_COMMUNITY)
Admission: EM | Admit: 2014-05-26 | Discharge: 2014-05-26 | Disposition: A | Discharge status: Home / Self Care | Payer: Commercial Managed Care - PPO | Attending: Emergency Medicine | Admitting: Emergency Medicine

## 2014-05-26 ENCOUNTER — Other Ambulatory Visit: Payer: Self-pay | Admitting: Oncology

## 2014-05-26 DIAGNOSIS — IMO0002 Reserved for concepts with insufficient information to code with codable children: Secondary | ICD-10-CM | POA: Insufficient documentation

## 2014-05-26 DIAGNOSIS — Z8719 Personal history of other diseases of the digestive system: Secondary | ICD-10-CM | POA: Insufficient documentation

## 2014-05-26 DIAGNOSIS — J45909 Unspecified asthma, uncomplicated: Secondary | ICD-10-CM | POA: Insufficient documentation

## 2014-05-26 DIAGNOSIS — R509 Fever, unspecified: Secondary | ICD-10-CM

## 2014-05-26 DIAGNOSIS — Z872 Personal history of diseases of the skin and subcutaneous tissue: Secondary | ICD-10-CM | POA: Diagnosis not present

## 2014-05-26 DIAGNOSIS — Z85828 Personal history of other malignant neoplasm of skin: Secondary | ICD-10-CM | POA: Insufficient documentation

## 2014-05-26 DIAGNOSIS — C259 Malignant neoplasm of pancreas, unspecified: Secondary | ICD-10-CM | POA: Insufficient documentation

## 2014-05-26 DIAGNOSIS — Z87891 Personal history of nicotine dependence: Secondary | ICD-10-CM | POA: Diagnosis not present

## 2014-05-26 LAB — BASIC METABOLIC PANEL
ANION GAP: 16 — AB (ref 5–15)
BUN: 11 mg/dL (ref 6–23)
CALCIUM: 9.4 mg/dL (ref 8.4–10.5)
CO2: 20 mEq/L (ref 19–32)
CREATININE: 0.72 mg/dL (ref 0.50–1.10)
Chloride: 95 mEq/L — ABNORMAL LOW (ref 96–112)
GFR calc Af Amer: >90 mL/min (ref >90)
GFR calc non Af Amer: 85 mL/min — ABNORMAL LOW (ref >90)
Glucose, Bld: 140 mg/dL — ABNORMAL HIGH (ref 70–99)
Potassium: 3.5 mEq/L — ABNORMAL LOW (ref 3.7–5.3)
SODIUM: 131 meq/L — AB (ref 137–147)

## 2014-05-26 LAB — CBC
HCT: 32.2 % — ABNORMAL LOW (ref 36.0–46.0)
Hemoglobin: 11 g/dL — ABNORMAL LOW (ref 12.0–15.0)
MCH: 28.8 pg (ref 26.0–34.0)
MCHC: 34.2 g/dL (ref 30.0–36.0)
MCV: 84.3 fL (ref 78.0–100.0)
Platelets: 126 10*3/uL — ABNORMAL LOW (ref 150–400)
RBC: 3.82 MIL/uL — AB (ref 3.87–5.11)
RDW: 13.9 % (ref 11.5–15.5)
WBC: 19 10*3/uL — ABNORMAL HIGH (ref 4.0–10.5)

## 2014-05-26 LAB — URINALYSIS, ROUTINE W REFLEX MICROSCOPIC
GLUCOSE, UA: NEGATIVE mg/dL
Ketones, ur: NEGATIVE mg/dL
NITRITE: NEGATIVE
PROTEIN: NEGATIVE mg/dL
Specific Gravity, Urine: 1.014 (ref 1.005–1.030)
UROBILINOGEN UA: 1 mg/dL (ref 0.0–1.0)
pH: 6 (ref 5.0–8.0)

## 2014-05-26 LAB — DIFFERENTIAL
BASOS PCT: 0 % (ref 0–1)
Basophils Absolute: 0 10*3/uL (ref 0.0–0.1)
EOS ABS: 0.1 10*3/uL (ref 0.0–0.7)
EOS PCT: 0 % (ref 0–5)
Lymphocytes Relative: 3 % — ABNORMAL LOW (ref 12–46)
Lymphs Abs: 0.6 10*3/uL — ABNORMAL LOW (ref 0.7–4.0)
Monocytes Absolute: 1.3 10*3/uL — ABNORMAL HIGH (ref 0.1–1.0)
Monocytes Relative: 7 % (ref 3–12)
Neutro Abs: 16.1 10*3/uL — ABNORMAL HIGH (ref 1.7–7.7)
Neutrophils Relative %: 90 % — ABNORMAL HIGH (ref 43–77)

## 2014-05-26 LAB — URINE MICROSCOPIC-ADD ON

## 2014-05-26 NOTE — ED Notes (Signed)
Pt had temperature earlier today, took Ibuprofen at 1400 and Tylenol at 1600. Last chemo 11 days ago. Denies chills, weakness, pain or any other sx.

## 2014-05-26 NOTE — ED Notes (Signed)
Pt states she is on chemo.  Spiked a temp of 101.2 today.  Called her doctor and they told her to come here.  Pt denies any complaints.

## 2014-05-26 NOTE — Discharge Instructions (Signed)

## 2014-05-26 NOTE — ED Provider Notes (Signed)
CSN: [id number]     Arrival date & time 05/26/14  1818 History   First MD Initiated Contact with Patient 05/26/14 [place]     Chief Complaint  Patient presents with  . Fever  . Chemotherapy     (Consider location/radiation/quality/duration/timing/severity/associated sxs/prior Treatment) Patient is a 68 y.o. female presenting with fever. The history is provided by the patient.  Fever Max temp prior to arrival:  101.2 Temp source:  Oral Severity:  Mild Onset quality:  Gradual Timing:  Constant Progression:  Unchanged Chronicity:  Recurrent Relieved by:  Nothing Worsened by:  Nothing tried Associated symptoms: no chest pain, no cough and no vomiting   Risk factors: hx of cancer (chemo 11 days ago, similar to prior)     Past Medical History  Diagnosis Date  . GERD (gastroesophageal reflux disease)   . Ulcer     in college  . Skin cancer   . Asthma     CHILDHOOD ASTHMA  . Nausea LAST 10 DAYS   Past Surgical History  Procedure Laterality Date  . C sections      X 2  . Abdominal hysterectomy    . Back surgery  56YRS AGO    L 5 DISCECTOMY  . Eus N/A 03/29/2014    Procedure: UPPER ENDOSCOPIC ULTRASOUND (EUS) LINEAR;  Surgeon: Milus Banister, MD;  Location: [facility];  Service: Endoscopy;  Laterality: N/A;  . Endoscopic retrograde cholangiopancreatography (ercp) with propofol N/A 03/29/2014    Procedure: ENDOSCOPIC RETROGRADE CHOLANGIOPANCREATOGRAPHY (ERCP) WITH PROPOFOL;  Surgeon: Milus Banister, MD;  Location: [facility];  Service: Endoscopy;  Laterality: N/A;   Family History  Problem Relation Age of Onset  . Colon cancer Neg Hx   . Stomach cancer Neg Hx   . Breast cancer Mother    History  Substance Use Topics  . Smoking status: Former Smoker -- 0.25 packs/day for 1 years    Quit date: 10/05/1961  . Smokeless tobacco: Never Used  . Alcohol Use: Yes     Comment: OCCASIONAL   OB History   Grav Para Term Preterm Abortions TAB SAB Ect Mult Living                  Review of Systems  Constitutional: Negative for fever.  Respiratory: Negative for cough and shortness of breath.   Cardiovascular: Negative for chest pain and leg swelling.  Gastrointestinal: Negative for vomiting and abdominal pain.  All other systems reviewed and are negative.     Allergies  Compazine  Home Medications   Prior to Admission medications   Medication Sig Start Date End Date Taking? Authorizing Provider  Alum & Mag Hydroxide-Simeth (MAGIC MOUTHWASH) SOLN Take 5 mLs by mouth 4 (four) times daily as needed for mouth pain. Swish for 1 minute, then spit for mouth sores 04/30/14  Yes Ladell Pier, MD  dexamethasone (DECADRON) 4 MG tablet Take 2 tablets (8 mg total) by mouth 2 (two) times daily. X 3 days after each chemo treatment-start day after chemo 05/14/14  Yes Ladell Pier, MD  lidocaine-prilocaine (EMLA) cream Apply 1 application topically as needed (port access).   Yes Historical Provider, MD  LORazepam (ATIVAN) 0.5 MG tablet Take 0.5 mg by mouth every 6 (six) hours as needed (nausea).   Yes Historical Provider, MD  ondansetron (ZOFRAN) 8 MG tablet Take 8 mg by mouth every 8 (eight) hours as needed for nausea or vomiting.   Yes Historical Provider, MD   BP 122/62  Pulse  65  Temp(Src) 98.2 F (36.8 C) (Oral)  Resp 18  SpO2 100% Physical Exam  Nursing note and vitals reviewed. Constitutional: She is oriented to person, place, and time. She appears well-developed. No distress.  Cachectic  HENT:  Head: Normocephalic and atraumatic.  Mouth/Throat: Oropharynx is clear and moist.  Eyes: EOM are normal. Pupils are equal, round, and reactive to light.  Neck: Normal range of motion. Neck supple.  Cardiovascular: Normal rate and regular rhythm.  Exam reveals no friction rub.   No murmur heard. Pulmonary/Chest: Effort normal and breath sounds normal. No respiratory distress. She has no wheezes. She has no rales.  Abdominal: Soft. She exhibits no distension. There  is no tenderness. There is no rebound.  Musculoskeletal: Normal range of motion. She exhibits no edema.  Neurological: She is alert and oriented to person, place, and time.  Skin: No rash noted. She is not diaphoretic.    ED Course  Procedures (including critical care time) Labs Review Labs Reviewed  CULTURE, BLOOD (ROUTINE X 2)  CULTURE, BLOOD (ROUTINE X 2)  CBC  URINALYSIS, ROUTINE W REFLEX MICROSCOPIC  BASIC METABOLIC PANEL    Imaging Review Dg Chest 2 View  05/26/2014   CLINICAL DATA:  Pancreas cancer.  Fever  EXAM: CHEST  2 VIEW  COMPARISON:  12/29/2013  FINDINGS: There is a right chest wall port a catheter with tip in the SVC. The heart size is normal. No pleural effusion. There is no airspace consolidation. There is a scoliosis deformity involving the lumbar spine.  IMPRESSION: No active cardiopulmonary disease.   Electronically Signed   By: Kerby Moors M.D.   On: 05/26/2014 19:17     EKG Interpretation None      MDM   Final diagnoses:  Other specified fever    68 year old female with stage IV pancreatic cancer here with fever. This is the second time this has happened. She gets chemotherapy in 11 days later most likely fever. She feels wonderful, denies any soreness of breath, vomiting, abdominal pain, headache, dizziness, difficulty urinating. She was on her way out to go play 9 holes of golf and then spiked a fever. She does not want to stay and she like to go home. I will send labs and talk to Oncology. Urine returned - appears contaminated. Labs ok, not neutropenic. Dr. Jana Hakim is ok with discharge, no antibiotics at this time. I spoke with her PCP Dr. Joylene Draft who is aware of her case.  Evelina Bucy, MD 05/27/14 [phone]

## 2014-05-27 ENCOUNTER — Other Ambulatory Visit: Payer: Self-pay | Admitting: Oncology

## 2014-05-28 LAB — URINE CULTURE
Colony Count: >=100000
Special Requests: NORMAL

## 2014-05-29 ENCOUNTER — Ambulatory Visit (HOSPITAL_BASED_OUTPATIENT_CLINIC_OR_DEPARTMENT_OTHER): Payer: Commercial Managed Care - PPO

## 2014-05-29 ENCOUNTER — Other Ambulatory Visit (HOSPITAL_BASED_OUTPATIENT_CLINIC_OR_DEPARTMENT_OTHER): Payer: Commercial Managed Care - PPO

## 2014-05-29 ENCOUNTER — Ambulatory Visit (HOSPITAL_BASED_OUTPATIENT_CLINIC_OR_DEPARTMENT_OTHER): Payer: Commercial Managed Care - PPO | Admitting: Oncology

## 2014-05-29 ENCOUNTER — Telehealth: Payer: Self-pay | Admitting: Oncology

## 2014-05-29 ENCOUNTER — Telehealth: Payer: Self-pay | Admitting: *Deleted

## 2014-05-29 ENCOUNTER — Other Ambulatory Visit: Payer: Self-pay | Admitting: *Deleted

## 2014-05-29 ENCOUNTER — Encounter: Payer: Commercial Managed Care - PPO | Admitting: Nutrition

## 2014-05-29 VITALS — BP 131/69 | HR 67 | Temp 97.9°F | Resp 20 | Ht 66.0 in | Wt 115.3 lb

## 2014-05-29 DIAGNOSIS — C787 Secondary malignant neoplasm of liver and intrahepatic bile duct: Secondary | ICD-10-CM

## 2014-05-29 DIAGNOSIS — R5381 Other malaise: Secondary | ICD-10-CM

## 2014-05-29 DIAGNOSIS — E876 Hypokalemia: Secondary | ICD-10-CM

## 2014-05-29 DIAGNOSIS — Z5111 Encounter for antineoplastic chemotherapy: Secondary | ICD-10-CM

## 2014-05-29 DIAGNOSIS — C259 Malignant neoplasm of pancreas, unspecified: Secondary | ICD-10-CM

## 2014-05-29 DIAGNOSIS — R197 Diarrhea, unspecified: Secondary | ICD-10-CM

## 2014-05-29 DIAGNOSIS — R748 Abnormal levels of other serum enzymes: Secondary | ICD-10-CM

## 2014-05-29 DIAGNOSIS — R5383 Other fatigue: Secondary | ICD-10-CM

## 2014-05-29 DIAGNOSIS — C250 Malignant neoplasm of head of pancreas: Secondary | ICD-10-CM

## 2014-05-29 DIAGNOSIS — K838 Other specified diseases of biliary tract: Secondary | ICD-10-CM

## 2014-05-29 DIAGNOSIS — R112 Nausea with vomiting, unspecified: Secondary | ICD-10-CM

## 2014-05-29 LAB — CBC WITH DIFFERENTIAL/PLATELET
BASO%: 0.6 % (ref 0.0–2.0)
Basophils Absolute: 0.1 10*3/uL (ref 0.0–0.1)
EOS ABS: 0.1 10*3/uL (ref 0.0–0.5)
EOS%: 1.1 % (ref 0.0–7.0)
HCT: 33.9 % — ABNORMAL LOW (ref 34.8–46.6)
HGB: 11.1 g/dL — ABNORMAL LOW (ref 11.6–15.9)
LYMPH%: 9.7 % — ABNORMAL LOW (ref 14.0–49.7)
MCH: 28.3 pg (ref 25.1–34.0)
MCHC: 32.6 g/dL (ref 31.5–36.0)
MCV: 87 fL (ref 79.5–101.0)
MONO#: 0.7 10*3/uL (ref 0.1–0.9)
MONO%: 5.7 % (ref 0.0–14.0)
NEUT%: 82.9 % — ABNORMAL HIGH (ref 38.4–76.8)
NEUTROS ABS: 10.2 10*3/uL — AB (ref 1.5–6.5)
Platelets: 169 10*3/uL (ref 145–400)
RBC: 3.9 10*6/uL (ref 3.70–5.45)
RDW: 14 % (ref 11.2–14.5)
WBC: 12.3 10*3/uL — AB (ref 3.9–10.3)
lymph#: 1.2 10*3/uL (ref 0.9–3.3)

## 2014-05-29 LAB — COMPREHENSIVE METABOLIC PANEL (CC13)
ALBUMIN: 3.2 g/dL — AB (ref 3.5–5.0)
ALT: 178 U/L — ABNORMAL HIGH (ref 0–55)
AST: 94 U/L — AB (ref 5–34)
Alkaline Phosphatase: 740 U/L — ABNORMAL HIGH (ref 40–150)
Anion Gap: 12 mEq/L — ABNORMAL HIGH (ref 3–11)
BILIRUBIN TOTAL: 1.11 mg/dL (ref 0.20–1.20)
BUN: 8.6 mg/dL (ref 7.0–26.0)
CO2: 23 mEq/L (ref 22–29)
Calcium: 9.5 mg/dL (ref 8.4–10.4)
Chloride: 103 mEq/L (ref 98–109)
Creatinine: 0.9 mg/dL (ref 0.6–1.1)
GLUCOSE: 130 mg/dL (ref 70–140)
POTASSIUM: 2.7 meq/L — AB (ref 3.5–5.1)
SODIUM: 138 meq/L (ref 136–145)
TOTAL PROTEIN: 6.5 g/dL (ref 6.4–8.3)

## 2014-05-29 LAB — CANCER ANTIGEN 19-9: CA 19-9: 1064.3 U/mL — ABNORMAL HIGH (ref <35.0)

## 2014-05-29 MED ORDER — FOSAPREPITANT DIMEGLUMINE INJECTION 150 MG
150.0000 mg | Freq: Once | INTRAVENOUS | Status: AC
  Administered 2014-05-29: 150 mg via INTRAVENOUS
  Filled 2014-05-29: qty 5

## 2014-05-29 MED ORDER — DEXAMETHASONE SODIUM PHOSPHATE 20 MG/5ML IJ SOLN
INTRAMUSCULAR | Status: AC
  Filled 2014-05-29: qty 5

## 2014-05-29 MED ORDER — SODIUM CHLORIDE 0.9 % IV SOLN
Freq: Once | INTRAVENOUS | Status: AC
  Administered 2014-05-29: 11:00:00 via INTRAVENOUS

## 2014-05-29 MED ORDER — SODIUM CHLORIDE 0.9 % IV SOLN
1920.0000 mg/m2 | INTRAVENOUS | Status: DC
  Administered 2014-05-29: 3100 mg via INTRAVENOUS
  Filled 2014-05-29: qty 62

## 2014-05-29 MED ORDER — ATROPINE SULFATE 1 MG/ML IJ SOLN
0.5000 mg | Freq: Once | INTRAMUSCULAR | Status: AC | PRN
  Administered 2014-05-29: 0.5 mg via INTRAVENOUS

## 2014-05-29 MED ORDER — POTASSIUM CHLORIDE CRYS ER 20 MEQ PO TBCR
40.0000 meq | EXTENDED_RELEASE_TABLET | Freq: Once | ORAL | Status: AC
  Administered 2014-05-29: 40 meq via ORAL
  Filled 2014-05-29: qty 2

## 2014-05-29 MED ORDER — POTASSIUM CHLORIDE CRYS ER 20 MEQ PO TBCR: 20.0000 meq | EXTENDED_RELEASE_TABLET | Freq: Every day | ORAL | Status: AC

## 2014-05-29 MED ORDER — ATROPINE SULFATE 1 MG/ML IJ SOLN
INTRAMUSCULAR | Status: AC
  Filled 2014-05-29: qty 1

## 2014-05-29 MED ORDER — DEXAMETHASONE SODIUM PHOSPHATE 20 MG/5ML IJ SOLN
12.0000 mg | Freq: Once | INTRAMUSCULAR | Status: AC
  Administered 2014-05-29: 12 mg via INTRAVENOUS

## 2014-05-29 MED ORDER — PALONOSETRON HCL INJECTION 0.25 MG/5ML
0.2500 mg | Freq: Once | INTRAVENOUS | Status: AC
  Administered 2014-05-29: 0.25 mg via INTRAVENOUS

## 2014-05-29 MED ORDER — DEXTROSE 5 % IV SOLN
85.0000 mg/m2 | Freq: Once | INTRAVENOUS | Status: AC
  Administered 2014-05-29: 135 mg via INTRAVENOUS
  Filled 2014-05-29: qty 27

## 2014-05-29 MED ORDER — DEXTROSE 5 % IV SOLN
Freq: Once | INTRAVENOUS | Status: AC
  Administered 2014-05-29: 14:00:00 via INTRAVENOUS

## 2014-05-29 MED ORDER — PALONOSETRON HCL INJECTION 0.25 MG/5ML
INTRAVENOUS | Status: AC
  Filled 2014-05-29: qty 5

## 2014-05-29 MED ORDER — LEUCOVORIN CALCIUM INJECTION 350 MG
400.0000 mg/m2 | Freq: Once | INTRAMUSCULAR | Status: AC
  Administered 2014-05-29: 644 mg via INTRAVENOUS
  Filled 2014-05-29: qty 32.2

## 2014-05-29 MED ORDER — IRINOTECAN HCL CHEMO INJECTION 100 MG/5ML
124.0000 mg/m2 | Freq: Once | INTRAVENOUS | Status: AC
  Administered 2014-05-29: 200 mg via INTRAVENOUS
  Filled 2014-05-29: qty 10

## 2014-05-29 NOTE — Progress Notes (Signed)
OK to treat with K 2.7 per Dr. Benay Spice via Amy, RN

## 2014-05-29 NOTE — Progress Notes (Signed)
[facility] OFFICE PROGRESS NOTE   Diagnosis: Pancreas cancer  INTERVAL HISTORY:   She returns as scheduled. She completed a second cycle of FOLFIRINOX 05/15/2014. She reports much less nausea and no vomiting following this cycle of chemotherapy. She has diarrhea every other day beginning on day for until they 12. The diarrhea was relieved with Imodium. She had approximately 2 loose stools on the days of diarrhea. She had a fever on 05/26/2014 and was evaluated in the emergency room. Cultures returned negative and there was no apparent source for infection. She was not neutropenic. No recurrent fever. She reports malaise in the afternoons. She is active and getting out of the house. Mild numbness of the toes. She had mild knee pain after receiving Neulasta.  Objective:  Vital signs in last 24 hours:  Blood pressure 131/69, pulse 67, temperature 97.9 F (36.6 C), temperature source Oral, resp. rate 20, height 5\' 6"  (1.676 m), weight 115 lb 4.8 oz (52.3 kg).    HEENT: No thrush or ulcer Resp: Lungs clear bilaterally Cardio: Regular rate and rhythm GI: No hepatomegaly, no mass, nontender Vascular: No leg edema Neuro: Very mild decrease in vibratory sense at the fingertips bilaterally  Portacath/PICC-without erythema  Lab Results:  Lab Results  Component Value Date   WBC 12.3* 05/29/2014   HGB 11.1* 05/29/2014   HCT 33.9* 05/29/2014   MCV 87.0 05/29/2014   PLT 169 05/29/2014   NEUTROABS 10.2* 05/29/2014   Potassium 2.7, creatinine 0.9, possibly 740, AST 94, ALT 178, bilirubin 1.1  Imaging:  Dg Chest 2 View  05/26/2014   CLINICAL DATA:  Pancreas cancer.  Fever  EXAM: CHEST  2 VIEW  COMPARISON:  12/29/2013  FINDINGS: There is a right chest wall port a catheter with tip in the SVC. The heart size is normal. No pleural effusion. There is no airspace consolidation. There is a scoliosis deformity involving the lumbar spine.  IMPRESSION: No active cardiopulmonary disease.    Electronically Signed   By: Kerby Moors M.D.   On: 05/26/2014 19:17    Medications: I have reviewed the patient's current medications.  Assessment/Plan: 1. 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 obstructive jaundice secondary to #1, status post placement of a bile duct stent 03/29/2014  3. delayed nausea and vomiting following cycle one FOLFIRINOX, improved with adjustment of the antiemetic regimen with cycle 2  4. admission with febrile neutropenia 05/08/2014 5. Elevated liver enzymes-persistent, related to chemotherapy versus tumor 6. hypokalemia-likely secondary to diarrhea, she will be placed on a potassium supplement  Disposition:  She tolerated cycle 2 FOLFIRINOX with less acute toxicity. The plan is to proceed with cycle 3 today. She will again receive Neulasta. She will begin a potassium supplement and return for a chemistry panel 06/04/2014.  The liver enzymes remain elevated. I discussed this with the [facility] pharmacist. No recommendation for dose reduction of the irinotecan or oxaliplatin.  Ms. Olds would like to again go to [facility] for a 2 day stay following this cycle of chemotherapy.  We will followup on the CA19-9 from  today and she will return for an office visit/chemotherapy on 06/14/2014.  Betsy Coder, MD  05/29/2014  10:15 AM

## 2014-05-29 NOTE — Telephone Encounter (Signed)
Per staff message and POF I have scheduled appts. Advised scheduler of appts. JMW  

## 2014-05-29 NOTE — Patient Instructions (Signed)
[facility] Discharge Instructions for Patients Receiving Chemotherapy  Today you received the following chemotherapy agents: Irinotecan, Leucovorin, Oxaliplatin, 5FU   To help prevent nausea and vomiting after your treatment, we encourage you to take your nausea medication as prescribed.    If you develop nausea and vomiting that is not controlled by your nausea medication, call the clinic.   BELOW ARE SYMPTOMS THAT SHOULD BE REPORTED IMMEDIATELY:  *FEVER GREATER THAN 100.5 F  *CHILLS WITH OR WITHOUT FEVER  NAUSEA AND VOMITING THAT IS NOT CONTROLLED WITH YOUR NAUSEA MEDICATION  *UNUSUAL SHORTNESS OF BREATH  *UNUSUAL BRUISING OR BLEEDING  TENDERNESS IN MOUTH AND THROAT WITH OR WITHOUT PRESENCE OF ULCERS  *URINARY PROBLEMS  *BOWEL PROBLEMS  UNUSUAL RASH Items with * indicate a potential emergency and should be followed up as soon as possible.  Feel free to call the clinic you have any questions or concerns. The clinic phone number is [phone].

## 2014-05-30 ENCOUNTER — Telehealth: Payer: Self-pay | Admitting: *Deleted

## 2014-05-30 NOTE — Telephone Encounter (Signed)
Ashley Perry called reporting patient to come to [facility] to receive IVF at 1030 am.  Orders received yesterday for IVF and lorazepam need to be called to [facility] in [place] for Lorazepam to be sent to [facility] ASAP.  Faxed order reads Ativan 0.5 mg IV every 6 hours prn nausea.   Called [facility] [phone]) at 8:45 and order given.  [facility] asked for quantity and number of refills.  Qty of three with one refill given to pharmacist with this call.

## 2014-05-31 ENCOUNTER — Ambulatory Visit (HOSPITAL_BASED_OUTPATIENT_CLINIC_OR_DEPARTMENT_OTHER): Payer: Commercial Managed Care - PPO

## 2014-05-31 VITALS — BP 110/67 | HR 110 | Temp 98.4°F

## 2014-05-31 DIAGNOSIS — Z5189 Encounter for other specified aftercare: Secondary | ICD-10-CM

## 2014-05-31 DIAGNOSIS — C250 Malignant neoplasm of head of pancreas: Secondary | ICD-10-CM

## 2014-05-31 DIAGNOSIS — C259 Malignant neoplasm of pancreas, unspecified: Secondary | ICD-10-CM

## 2014-05-31 DIAGNOSIS — C787 Secondary malignant neoplasm of liver and intrahepatic bile duct: Secondary | ICD-10-CM | POA: Diagnosis not present

## 2014-05-31 MED ORDER — PEGFILGRASTIM INJECTION 6 MG/0.6ML
6.0000 mg | Freq: Once | SUBCUTANEOUS | Status: AC
  Administered 2014-05-31: 6 mg via SUBCUTANEOUS
  Filled 2014-05-31: qty 0.6

## 2014-05-31 MED ORDER — SODIUM CHLORIDE 0.9 % IJ SOLN
10.0000 mL | INTRAMUSCULAR | Status: DC | PRN
  Administered 2014-05-31: 10 mL
  Filled 2014-05-31: qty 10

## 2014-05-31 MED ORDER — HEPARIN SOD (PORK) LOCK FLUSH 100 UNIT/ML IV SOLN
500.0000 [IU] | Freq: Once | INTRAVENOUS | Status: AC | PRN
  Administered 2014-05-31: 500 [IU]
  Filled 2014-05-31: qty 5

## 2014-06-02 LAB — CULTURE, BLOOD (ROUTINE X 2)
Culture: NO GROWTH
Culture: NO GROWTH

## 2014-06-04 ENCOUNTER — Telehealth: Payer: Self-pay | Admitting: *Deleted

## 2014-06-04 ENCOUNTER — Other Ambulatory Visit (HOSPITAL_BASED_OUTPATIENT_CLINIC_OR_DEPARTMENT_OTHER): Payer: Commercial Managed Care - PPO

## 2014-06-04 ENCOUNTER — Other Ambulatory Visit: Payer: Self-pay | Admitting: *Deleted

## 2014-06-04 DIAGNOSIS — C787 Secondary malignant neoplasm of liver and intrahepatic bile duct: Secondary | ICD-10-CM

## 2014-06-04 DIAGNOSIS — C259 Malignant neoplasm of pancreas, unspecified: Secondary | ICD-10-CM

## 2014-06-04 DIAGNOSIS — C250 Malignant neoplasm of head of pancreas: Secondary | ICD-10-CM

## 2014-06-04 LAB — COMPREHENSIVE METABOLIC PANEL (CC13)
ALT: 277 U/L — AB (ref 0–55)
ANION GAP: 10 meq/L (ref 3–11)
AST: 121 U/L — ABNORMAL HIGH (ref 5–34)
Albumin: 3.2 g/dL — ABNORMAL LOW (ref 3.5–5.0)
Alkaline Phosphatase: 783 U/L — ABNORMAL HIGH (ref 40–150)
BILIRUBIN TOTAL: 1.79 mg/dL — AB (ref 0.20–1.20)
BUN: 18 mg/dL (ref 7.0–26.0)
CALCIUM: 9.1 mg/dL (ref 8.4–10.4)
CHLORIDE: 100 meq/L (ref 98–109)
CO2: 23 meq/L (ref 22–29)
Creatinine: 0.8 mg/dL (ref 0.6–1.1)
GLUCOSE: 146 mg/dL — AB (ref 70–140)
Potassium: 3.8 mEq/L (ref 3.5–5.1)
Sodium: 133 mEq/L — ABNORMAL LOW (ref 136–145)
Total Protein: 6.4 g/dL (ref 6.4–8.3)

## 2014-06-04 NOTE — Telephone Encounter (Signed)
Per staff phone call and POF I have schedueld appts. Patient notified and request to have MD visit on Monday. Message forwarded to the desk RN   JMW

## 2014-06-05 ENCOUNTER — Other Ambulatory Visit: Payer: Self-pay | Admitting: Oncology

## 2014-06-05 ENCOUNTER — Other Ambulatory Visit: Payer: Self-pay | Admitting: *Deleted

## 2014-06-05 ENCOUNTER — Telehealth: Payer: Self-pay | Admitting: Oncology

## 2014-06-05 DIAGNOSIS — C259 Malignant neoplasm of pancreas, unspecified: Secondary | ICD-10-CM

## 2014-06-05 NOTE — Telephone Encounter (Signed)
Lft msg per 09/01 POF r/s md from 09/16 to 09/14, mailed updated sch....[name]

## 2014-06-14 ENCOUNTER — Ambulatory Visit: Payer: Commercial Managed Care - PPO | Admitting: Nurse Practitioner

## 2014-06-14 ENCOUNTER — Ambulatory Visit: Payer: Commercial Managed Care - PPO

## 2014-06-14 ENCOUNTER — Telehealth: Payer: Self-pay | Admitting: *Deleted

## 2014-06-14 ENCOUNTER — Other Ambulatory Visit: Payer: Commercial Managed Care - PPO

## 2014-06-14 NOTE — Telephone Encounter (Signed)
Called pt to offer office visit for 4PM today. Pt declined. Daughter coming in from [place] to accompany her to [facility] visit. Pt reports she will be dropping of a copy of scan on disc to this office today. Dr. Benay Spice made aware.

## 2014-06-18 ENCOUNTER — Ambulatory Visit (HOSPITAL_BASED_OUTPATIENT_CLINIC_OR_DEPARTMENT_OTHER): Payer: Commercial Managed Care - PPO | Admitting: Oncology

## 2014-06-18 ENCOUNTER — Telehealth: Payer: Self-pay | Admitting: Oncology

## 2014-06-18 VITALS — BP 115/69 | HR 70 | Temp 98.0°F | Resp 18 | Ht 66.0 in | Wt 110.5 lb

## 2014-06-18 DIAGNOSIS — E876 Hypokalemia: Secondary | ICD-10-CM

## 2014-06-18 DIAGNOSIS — C787 Secondary malignant neoplasm of liver and intrahepatic bile duct: Secondary | ICD-10-CM

## 2014-06-18 DIAGNOSIS — C250 Malignant neoplasm of head of pancreas: Secondary | ICD-10-CM

## 2014-06-18 DIAGNOSIS — C259 Malignant neoplasm of pancreas, unspecified: Secondary | ICD-10-CM

## 2014-06-18 NOTE — Telephone Encounter (Signed)
gv pt appt schedule for sept. appt cxd and added per 9/14 pof. no care plan at this time. per pof tx is gem/abraxan 9/22 and 9/29.

## 2014-06-18 NOTE — Progress Notes (Signed)
[facility] OFFICE PROGRESS NOTE   Diagnosis: Pancreas cancer  INTERVAL HISTORY:   Ms. Dattilio returns as scheduled. She completed a third cycle of FOLFIRINOX on 05/29/2014. No vomiting following chemotherapy. She reports a funny feeling in her extremities on the day of oxaliplatin. She has developed a mild right foot drop over the past few weeks that is partially improved today. No other neurologic symptoms. Her chief complaint following chemotherapy was malaise. Her activity level has improved over the past week.  She was seen in followup at the [facility] on 06/13/2014. A restaging CT evaluation revealed increased and new liver metastases and a stable pancreas mass. The CA 19-9 was higher. Dr. Floy Sabina recommends treatment with gemcitabine/Abraxane. She is not a candidate for a clinical based on the elevated liver enzymes      Objective:  Vital signs in last 24 hours:  Blood pressure 115/69, pulse 70, temperature 98 F (36.7 C), temperature source Oral, resp. rate 18, height 5\' 6"  (1.676 m), weight 110 lb 8 oz (50.122 kg).    HEENT: No thrush or ulcer Resp: Lungs clear bilaterally Cardio: Regular rate and rhythm GI: No hepatomegaly, no mass Vascular: No leg edema Neuro: 4+/5 strength with dorsi flexion at the right foot. The motor exam is otherwise intact at the lower Chairman. The deep tendon reflexes are 1-2+ at the knees.    Portacath/PICC-without erythema  Lab Results: 06/13/2014 at [facility]: Creatinine 1.1, alkaline phosphatase 96, AST 160, ALT 129, bilirubin 1    Imaging:  CT at [facility] 06/13/2014 that stable pancreas mass, increased size and number of liver metastases Medications: I have reviewed the patient's current medications.  Assessment/Plan: 1. 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 obstructive jaundice secondary to #1, status post placement of a bile duct stent 03/29/2014  3. delayed nausea and vomiting following cycle one FOLFIRINOX, improved with adjustment of the antiemetic regimen with cycle 2  4. admission with febrile neutropenia 05/08/2014  5. Elevated liver enzymes-persistent, likely related to tumor progression 6. hypokalemia-likely secondary to diarrhea, she will be placed on a potassium supplement  7. right foot drop-partial, most likely related to weight loss and peripheral nerve compression   Disposition:  Ms. Holwerda completed 3 cycles of FOLFIRINOX. The CA 19-9 is higher and a restaging CT 06/13/2014 confirmed evidence of disease progression.  I discussed treatment options at length with Ms. Shepler and her family. We discussed supportive care, single agent gemcitabine, and gemcitabine/Abraxane. She understands no therapy will be curative and there is no proof of a survival benefit with second line chemotherapy in patients with metastatic pancreas cancer.  We reviewed the potential toxicities associated with the gemcitabine/Abraxane regimen. She understands the chance for nausea, mucositis, alopecia, and hematologic toxicity. We discussed the fever, rash, and pneumonitis seen with gemcitabine. We reviewed the allergic reaction and neuropathy associated with Abraxane.  She plans to consider treatment options for the next several days and contact us with  her decision on supportive care versus  a trial of gemcitabine/Abraxane. Her performance status appears adequate to complete a trial of second line chemotherapy. We will check a baseline chemistry panel and CA 19-9 prior  to beginning chemotherapy. We will dose adjust the chemotherapy as indicated.   Approximately 45 minutes were spent with patient today. The majority of the time was used for counseling and coordination of care.  Betsy Coder, MD  06/18/2014  2:36 PM

## 2014-06-20 ENCOUNTER — Other Ambulatory Visit: Payer: Commercial Managed Care - PPO

## 2014-06-20 ENCOUNTER — Encounter: Payer: Commercial Managed Care - PPO | Admitting: Nutrition

## 2014-06-20 ENCOUNTER — Ambulatory Visit: Payer: Commercial Managed Care - PPO | Admitting: Oncology

## 2014-06-20 ENCOUNTER — Ambulatory Visit: Payer: Commercial Managed Care - PPO

## 2014-06-21 ENCOUNTER — Telehealth: Payer: Self-pay | Admitting: *Deleted

## 2014-06-21 NOTE — Telephone Encounter (Signed)
Received message from pt that she has decided NOT to continue treatment at this time.  Dr. Benay Spice made aware of pt decision.  Per Dr. Benay Spice; notified pt that future appt will be cancelled per request but that Dr. Benay Spice would like for her to keep office visit appt.  Pt verbalized understanding and confirmed appt for 07/03/14 @ 8:30.

## 2014-06-25 ENCOUNTER — Other Ambulatory Visit: Payer: Commercial Managed Care - PPO

## 2014-06-26 ENCOUNTER — Ambulatory Visit: Payer: Commercial Managed Care - PPO

## 2014-06-26 ENCOUNTER — Encounter: Payer: Commercial Managed Care - PPO | Admitting: Nutrition

## 2014-06-28 ENCOUNTER — Ambulatory Visit: Payer: Commercial Managed Care - PPO

## 2014-06-28 ENCOUNTER — Other Ambulatory Visit: Payer: Commercial Managed Care - PPO

## 2014-06-28 ENCOUNTER — Encounter: Payer: Commercial Managed Care - PPO | Admitting: Nutrition

## 2014-06-28 ENCOUNTER — Ambulatory Visit: Payer: Commercial Managed Care - PPO | Admitting: Oncology

## 2014-07-03 ENCOUNTER — Ambulatory Visit (HOSPITAL_BASED_OUTPATIENT_CLINIC_OR_DEPARTMENT_OTHER): Payer: Commercial Managed Care - PPO

## 2014-07-03 ENCOUNTER — Other Ambulatory Visit: Payer: Commercial Managed Care - PPO

## 2014-07-03 ENCOUNTER — Encounter: Payer: Self-pay | Admitting: Oncology

## 2014-07-03 ENCOUNTER — Ambulatory Visit (HOSPITAL_BASED_OUTPATIENT_CLINIC_OR_DEPARTMENT_OTHER): Payer: Commercial Managed Care - PPO | Admitting: Oncology

## 2014-07-03 ENCOUNTER — Ambulatory Visit: Payer: Commercial Managed Care - PPO

## 2014-07-03 ENCOUNTER — Encounter: Payer: Commercial Managed Care - PPO | Admitting: Nutrition

## 2014-07-03 VITALS — BP 104/71 | HR 88 | Temp 97.9°F | Resp 18 | Ht 66.0 in | Wt 109.9 lb

## 2014-07-03 DIAGNOSIS — C250 Malignant neoplasm of head of pancreas: Secondary | ICD-10-CM

## 2014-07-03 DIAGNOSIS — E876 Hypokalemia: Secondary | ICD-10-CM

## 2014-07-03 DIAGNOSIS — Z452 Encounter for adjustment and management of vascular access device: Secondary | ICD-10-CM

## 2014-07-03 DIAGNOSIS — Z95828 Presence of other vascular implants and grafts: Secondary | ICD-10-CM

## 2014-07-03 DIAGNOSIS — C787 Secondary malignant neoplasm of liver and intrahepatic bile duct: Secondary | ICD-10-CM

## 2014-07-03 DIAGNOSIS — C259 Malignant neoplasm of pancreas, unspecified: Secondary | ICD-10-CM

## 2014-07-03 MED ORDER — HYDROCODONE-ACETAMINOPHEN 5-325 MG PO TABS: 1.0000 | ORAL_TABLET | ORAL | Status: DC | PRN

## 2014-07-03 MED ORDER — SODIUM CHLORIDE 0.9 % IJ SOLN
10.0000 mL | INTRAMUSCULAR | Status: DC | PRN
  Administered 2014-07-03: 10 mL via INTRAVENOUS
  Filled 2014-07-03: qty 10

## 2014-07-03 MED ORDER — HEPARIN SOD (PORK) LOCK FLUSH 100 UNIT/ML IV SOLN
500.0000 [IU] | Freq: Once | INTRAVENOUS | Status: AC
  Administered 2014-07-03: 500 [IU] via INTRAVENOUS
  Filled 2014-07-03: qty 5

## 2014-07-03 NOTE — Patient Instructions (Signed)

## 2014-07-03 NOTE — Progress Notes (Signed)
  [facility] OFFICE PROGRESS NOTE   Diagnosis: Pancreas cancer  INTERVAL HISTORY:   She returns as scheduled. Ashley Perry decided against further chemotherapy. She met with Dr. Joylene Draft and decided to enroll in the [facility] hospice program.  Her appetite and energy level have improved. She is walking 45 minutes daily. The right foot drop has improved. She has intermittent vague discomfort in the subxiphoid region. No significant pain. She uses magnesium and a suppository for constipation.  Objective:  Vital signs in last 24 hours:  Blood pressure 104/71, pulse 88, temperature 97.9 F (36.6 C), temperature source Oral, resp. rate 18, height $RemoveBe'5\' 6"'[id number]$  (1.676 m), weight 109 lb 14.4 oz (49.85 kg), SpO2 100.00%.    Resp: Lungs clear bilaterally Cardio: Regular rhythm with premature beats GI: No hepatomegaly, no mass, nontender Vascular: No leg edema Neuro: 4+/5 strength with dorsi flexion at the right foot    Portacath/PICC-without erythema   Medications: I have reviewed the patient's current medications.  Assessment/Plan: 1. 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 obstructive jaundice secondary to #1, status post placement of a bile duct stent 03/29/2014  3. delayed nausea and vomiting following cycle one FOLFIRINOX, improved with adjustment of the antiemetic regimen with cycle 2  4. admission with febrile  neutropenia 05/08/2014  5. Elevated liver enzymes-persistent, likely related to tumor progression  6. history of hypokalemia-likely secondary to diarrhea, she was placed on a potassium supplement  7. right foot drop-partial, most likely related to weight loss and peripheral nerve compression -improved   Disposition:  Ashley Perry has an improved performance status. She has decided against further chemotherapy. She plans to enroll in the [facility] program. She will leave for a trip to [place] tomorrow plans to return on 07/22/2014. She will be scheduled for office visit and Port-A-Cath flush 08/02/2014. She knows to contact us in the interim for new symptoms. She plans to see her cardiologist while in [place] and will receive a 13 valent pneumococcal vaccine there. She has received an influenza vaccine.  I gave her a prescription for hydrocodone to have on hand if she develops pain.  Betsy Coder, MD  07/03/2014  10:21 AM

## 2014-07-04 ENCOUNTER — Telehealth: Payer: Self-pay | Admitting: Oncology

## 2014-07-04 NOTE — Telephone Encounter (Signed)
s.w. pt and advised on OCT appt....pt ok and aware °

## 2014-07-05 ENCOUNTER — Ambulatory Visit: Payer: Commercial Managed Care - PPO | Admitting: Oncology

## 2014-07-24 ENCOUNTER — Other Ambulatory Visit: Payer: Self-pay | Admitting: *Deleted

## 2014-07-24 DIAGNOSIS — C257 Malignant neoplasm of other parts of pancreas: Secondary | ICD-10-CM

## 2014-07-24 MED ORDER — LORAZEPAM 0.5 MG PO TABS: ORAL_TABLET | ORAL | Status: AC

## 2014-07-26 ENCOUNTER — Telehealth: Payer: Self-pay | Admitting: Oncology

## 2014-07-26 ENCOUNTER — Telehealth: Payer: Self-pay | Admitting: *Deleted

## 2014-07-26 DIAGNOSIS — C257 Malignant neoplasm of other parts of pancreas: Secondary | ICD-10-CM

## 2014-07-26 MED ORDER — HYDROCODONE-ACETAMINOPHEN 5-325 MG PO TABS: 1.0000 | ORAL_TABLET | ORAL | Status: DC | PRN

## 2014-07-26 NOTE — Telephone Encounter (Signed)
returned pt call and r/s appt she is going [place]..done....pt ok adn aware

## 2014-07-26 NOTE — Telephone Encounter (Signed)
Leaving for trip to [place] Sunday and needs to refill her pain medication before she goes.

## 2014-07-27 ENCOUNTER — Telehealth: Payer: Self-pay | Admitting: *Deleted

## 2014-07-27 ENCOUNTER — Other Ambulatory Visit: Payer: Self-pay | Admitting: *Deleted

## 2014-07-27 MED ORDER — HYDROMORPHONE HCL 2 MG PO TABS: ORAL_TABLET | ORAL | Status: AC

## 2014-07-27 NOTE — Telephone Encounter (Signed)
Pt called requested "stronger pain med; I have taken 2 tablets of Vicodin an hour ago and my pain is still very significant"  Pt states pain is in her stomach and chest.  Per Dr. Benay Spice; called and notified pt that MD prescribed Dilaudid and asked if she felt she needed to be seen.  Pt states she did not feel like she needed to see Dr. Benay Spice; still deciding if she will leave for [place] on Sunday; will see how Dilaudid works for her and will call if any problems.  Pt confirmed appt for 08/20/14.  MD made aware.

## 2014-08-02 ENCOUNTER — Ambulatory Visit: Payer: Commercial Managed Care - PPO | Admitting: Oncology

## 2014-08-19 ENCOUNTER — Other Ambulatory Visit: Payer: Self-pay | Admitting: Oncology

## 2014-08-20 ENCOUNTER — Telehealth: Payer: Self-pay | Admitting: Nurse Practitioner

## 2014-08-20 ENCOUNTER — Ambulatory Visit (HOSPITAL_BASED_OUTPATIENT_CLINIC_OR_DEPARTMENT_OTHER): Payer: Commercial Managed Care - PPO | Admitting: Nurse Practitioner

## 2014-08-20 ENCOUNTER — Telehealth: Payer: Self-pay

## 2014-08-20 VITALS — BP 102/65 | HR 69 | Temp 97.5°F | Resp 17 | Ht 66.0 in | Wt 101.5 lb

## 2014-08-20 DIAGNOSIS — C250 Malignant neoplasm of head of pancreas: Secondary | ICD-10-CM

## 2014-08-20 DIAGNOSIS — C787 Secondary malignant neoplasm of liver and intrahepatic bile duct: Secondary | ICD-10-CM

## 2014-08-20 DIAGNOSIS — E876 Hypokalemia: Secondary | ICD-10-CM

## 2014-08-20 DIAGNOSIS — R112 Nausea with vomiting, unspecified: Secondary | ICD-10-CM

## 2014-08-20 DIAGNOSIS — C257 Malignant neoplasm of other parts of pancreas: Secondary | ICD-10-CM

## 2014-08-20 DIAGNOSIS — E8809 Other disorders of plasma-protein metabolism, not elsewhere classified: Secondary | ICD-10-CM

## 2014-08-20 MED ORDER — ONDANSETRON HCL 8 MG PO TABS: 8.0000 mg | ORAL_TABLET | Freq: Two times a day (BID) | ORAL | Status: AC

## 2014-08-20 MED ORDER — HYDROCODONE-ACETAMINOPHEN 5-325 MG PO TABS: 1.0000 | ORAL_TABLET | ORAL | Status: AC | PRN

## 2014-08-20 NOTE — Telephone Encounter (Signed)
Gave avs & cal for Dec. °

## 2014-08-20 NOTE — Telephone Encounter (Signed)
Called [facility], pt states her PCP had referred her to Hospice approx. a month ago. Called to verify referral with [facility]. State there was no referral in the system for that patient. Referral was made over the phone.

## 2014-08-20 NOTE — Progress Notes (Addendum)
  [facility] OFFICE PROGRESS NOTE   Diagnosis:  Pancreas cancer  INTERVAL HISTORY:   Ashley Perry returns for followup of pancreas cancer. She reports weight loss since her last visit. She attributes this to "underlying nausea". No vomiting. The nausea is controlled with Zofran. Pain overall is well controlled. She takes hydrocodone as needed. Bowels moving. She utilizes an enema periodically.  Objective:  Vital signs in last 24 hours:  Blood pressure 102/65, pulse 69, temperature 97.5 F (36.4 C), temperature source Oral, resp. rate 17, height 5\' 6"  (1.676 m), weight 101 lb 8 oz (46.04 kg), SpO2 100 %.    HEENT: no thrush or ulcers. Resp: lungs clear bilaterally. Cardio: regular rate and rhythm. GI: abdomen nontender. No hepatomegaly. Fullness in the epigastric region. No discrete mass. Vascular: no leg edema.  Port-A-Cath without erythema.    Lab Results:  Lab Results  Component Value Date   WBC 12.3* 05/29/2014   HGB 11.1* 05/29/2014   HCT 33.9* 05/29/2014   MCV 87.0 05/29/2014   PLT 169 05/29/2014   NEUTROABS 10.2* 05/29/2014    Imaging:  No results found.  Medications: I have reviewed the patient's current medications.  Assessment/Plan: 1. 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 obstructive jaundice secondary to #1, status post placement of a bile duct stent 03/29/2014   3. delayed nausea and vomiting following cycle one FOLFIRINOX, improved with adjustment of the antiemetic regimen with cycle 2  4. admission with febrile neutropenia 05/08/2014  5. Elevated liver enzymes-persistent, likely related to tumor progression  6. history of hypokalemia-likely secondary to diarrhea, she was placed on a potassium supplement  7. right foot drop-partial, most likely related to weight loss and peripheral nerve compression -improved   Disposition: Ashley Perry performance status is slowly declining. She would like to enroll in the [facility] program. She reports a referral has been made. We will contact the hospice program today and request they call her. She will return for a followup visit in 4 weeks. She will contact the office in the interim with any problems.   She was given a prescription for hydrocodone at today's visit and a prescription was sent to her pharmacy for Zofran.  She confirmed her desire for NO CODE BLUE status.  Patient seen with Dr. Benay Spice.  Ned Card ANP/GNP-BC   08/20/2014  2:15 PM  This was a shared visit with Ned Card.  Ashley Perry has returned to [facility] after attending an alternative medicine clinic. She again stated that she does not wish to receive further chemotherapy. She agrees to a [facility] hospice referral and will be placed on a NCB status.  Julieanne Manson, MD

## 2014-08-23 ENCOUNTER — Telehealth: Payer: Self-pay | Admitting: *Deleted

## 2014-08-23 NOTE — Telephone Encounter (Signed)
Received message from Ashley Perry--[facility] states: Pt was scheduled to be seen this week but pt declined due to her Mother is dying; will soon pass.  Pt will be re-schedule to be seen by Hospice later next week.  Note to Dr. Benay Spice.

## 2014-08-27 ENCOUNTER — Telehealth: Payer: Self-pay | Admitting: *Deleted

## 2014-08-27 NOTE — Telephone Encounter (Signed)
Received message from pt stating "I need something else from nausea; the Zofran is not working; but don't give me Compazine"  Per Dr. Benay Spice; asked pt if she has tried her Ativan for nausea; states "no I haven't in a while; I'll try it"  Pt offered Reglan but explained it similar to Compazine; pt declines; states "I don't want anything related to compazine"  Pt instructed to call if further problems.  Pt confirmed that she will and she is eating/drinking "OK" right now.  Pt verbalized understanding of information and will try Ativan now.

## 2014-08-29 ENCOUNTER — Telehealth: Payer: Self-pay | Admitting: *Deleted

## 2014-08-29 NOTE — Telephone Encounter (Signed)
Called Hospice and requested they contact patient today to see if she wants them to see her now. She had called during the night (On call MD) for increased nausea. Evette will make contact today.

## 2014-08-31 ENCOUNTER — Telehealth: Payer: Self-pay | Admitting: *Deleted

## 2014-08-31 DIAGNOSIS — C257 Malignant neoplasm of other parts of pancreas: Secondary | ICD-10-CM

## 2014-08-31 MED ORDER — DIPHENHYDRAMINE HCL 25 MG PO CAPS: 25.0000 mg | ORAL_CAPSULE | Freq: Four times a day (QID) | ORAL | Status: AC | PRN

## 2014-08-31 NOTE — Addendum Note (Signed)
Addended by: Cherylynn Ridges on: 08/31/2014 12:41 PM   Modules accepted: Orders

## 2014-08-31 NOTE — Telephone Encounter (Signed)
Patient admitted to Hospice.  Ashley Perry reports recurrent jaundice with her pancreatic cancer with itching.  Would like an order for something for itching called to [facility] at [facility].  Also call Ashley Perry at [phone] to update medication list with Hospice.  Will notify provider.

## 2014-08-31 NOTE — Telephone Encounter (Signed)
Verbal order received and read back from Dr. Benay Spice for OTC Benadryl for the itching and for Hospice to notify Gastroenetrologist, Dr. Ardis Hughs.  Patient has a bili stent.   Called [place] with these orders.

## 2014-09-05 ENCOUNTER — Telehealth: Payer: Self-pay | Admitting: *Deleted

## 2014-09-05 NOTE — Telephone Encounter (Signed)
Call from pt's daughter requesting Carafate refill for her dry mouth. Reports dry mouth is interfering with her appetite and hydration. Returned call, recommended she try Biotene mouthwash and lozenges for dry mouth. Will need to push fluids ultimately to improve this. Also recommended she call hospice RN to assess. She has not noticed any white patches in pt's mouth.

## 2014-09-25 ENCOUNTER — Ambulatory Visit: Payer: Commercial Managed Care - PPO | Admitting: Oncology

## 2014-09-25 NOTE — Progress Notes (Signed)
05/22/14 FAXED [place], RN @ [facility] [phone] HER PHONE # IS [phone].

## 2014-10-05 DEATH — deceased

## 2015-07-07 IMAGING — CT CT ABD-PELV W/ CM
2 of 5 series · 15 of 46 positions shown, 17 images · IV contrast (Omnipaque 300)
Comparison: No priors.  Chest CT 09/23/2012.

CLINICAL DATA: Epigastric pain.

EXAM:
CT ABDOMEN AND PELVIS WITH CONTRAST
TECHNIQUE: Multidetector CT imaging of the abdomen and pelvis was performed
using the standard protocol following bolus administration of
intravenous contrast.
CONTRAST:  80mL OMNIPAQUE IOHEXOL 300 MG/ML  SOLN

[Series 2: abd/ pel 5mm · axial · 0.70mm/px · z∈[-448,-84]mm · 12 of 83 slices shown, 14 images]
[im 5/83  soft-tissue]
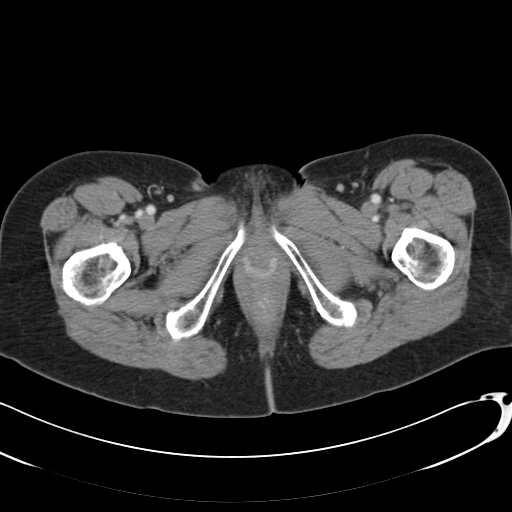
[im 5/83  bone]
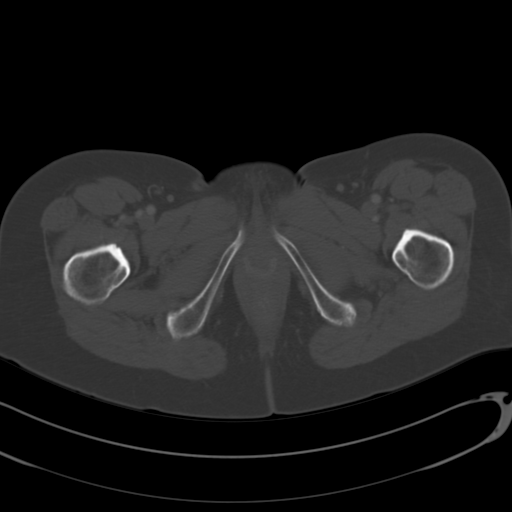
[im 14/83  soft-tissue]
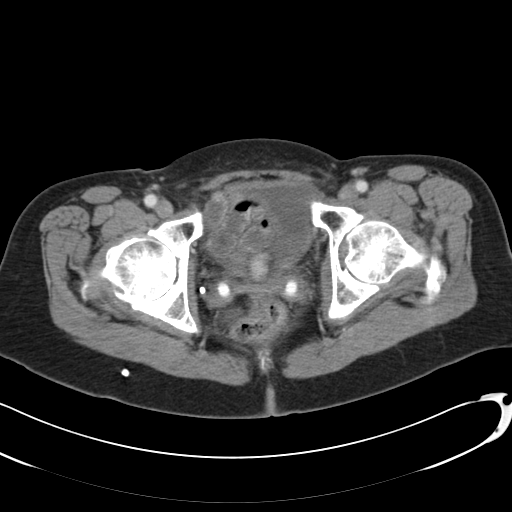
[im 19/83  soft-tissue]
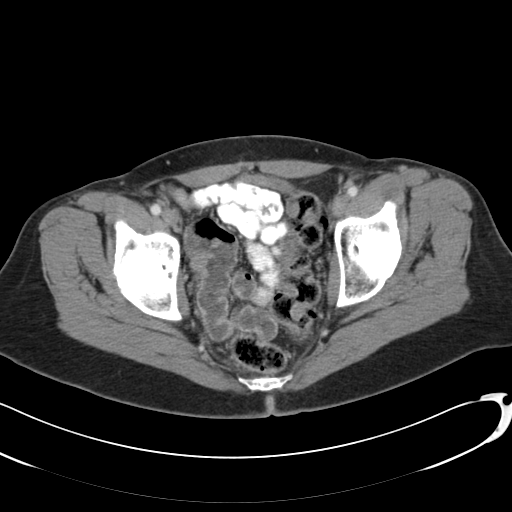
[im 23/83  soft-tissue]
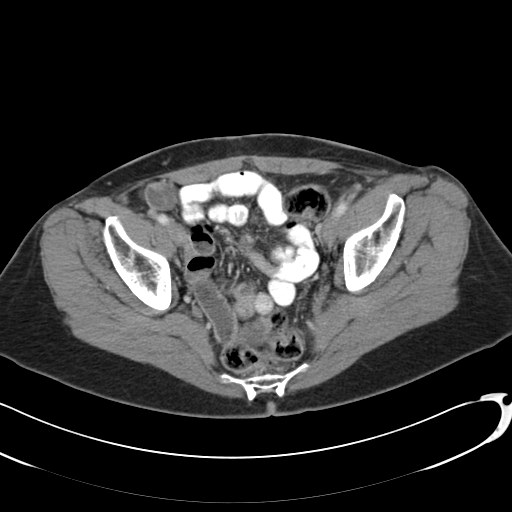
[im 32/83  soft-tissue]
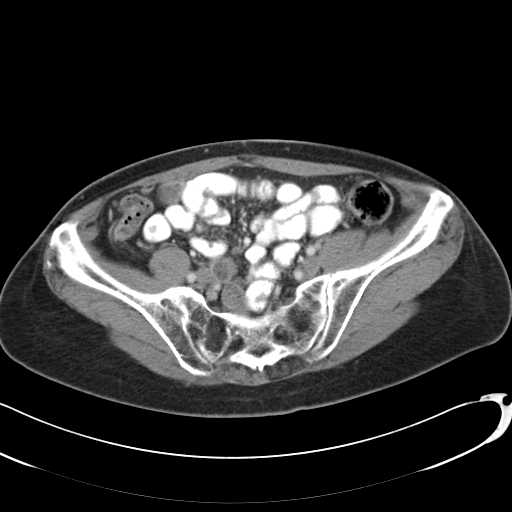
[im 37/83  soft-tissue]
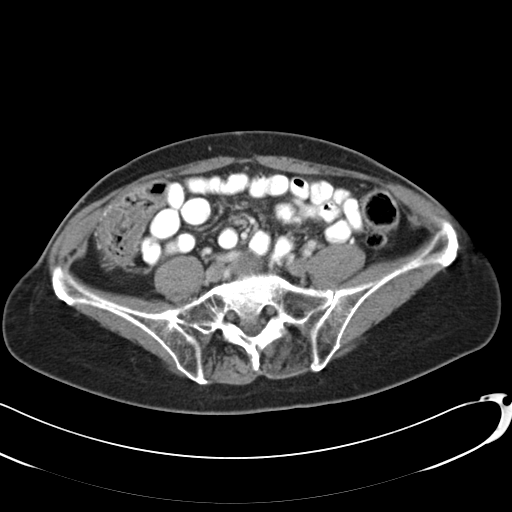
[im 46/83  soft-tissue]
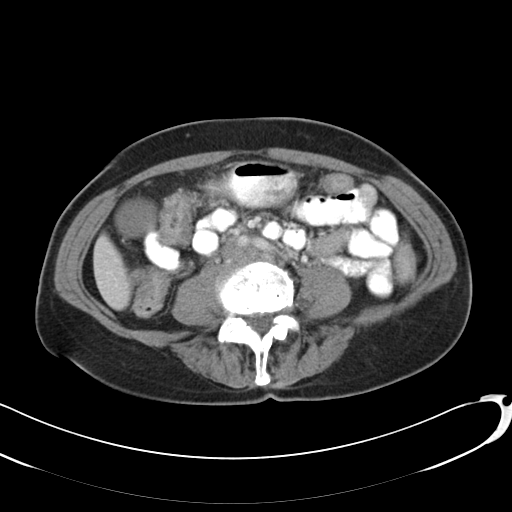
[im 51/83  soft-tissue]
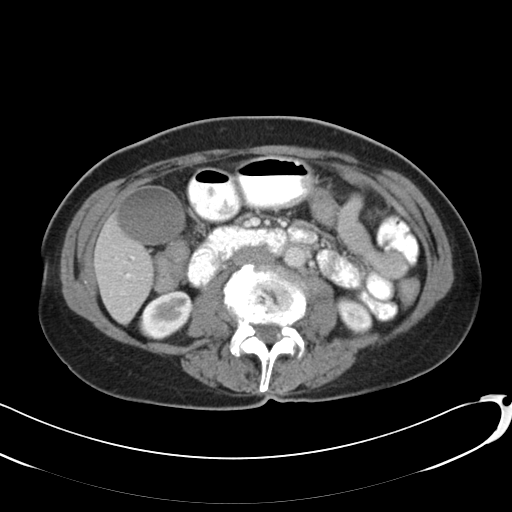
[im 60/83  soft-tissue]
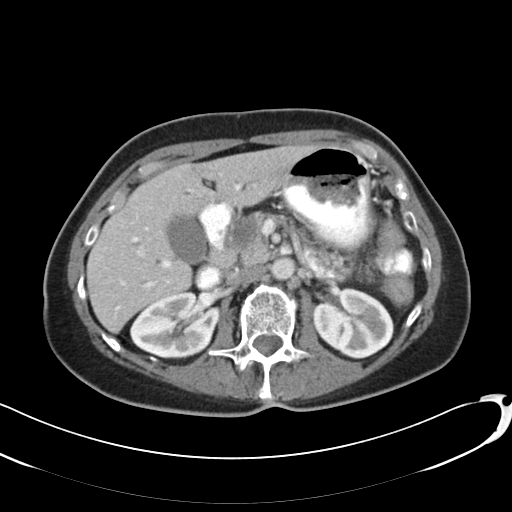
[im 60/83  bone]
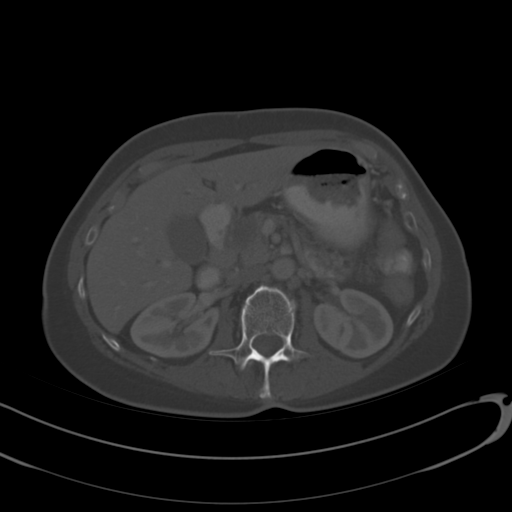
[im 64/83  soft-tissue]
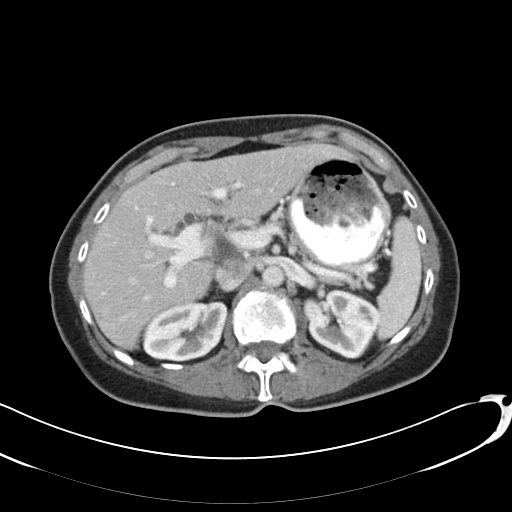
[im 69/83  soft-tissue]
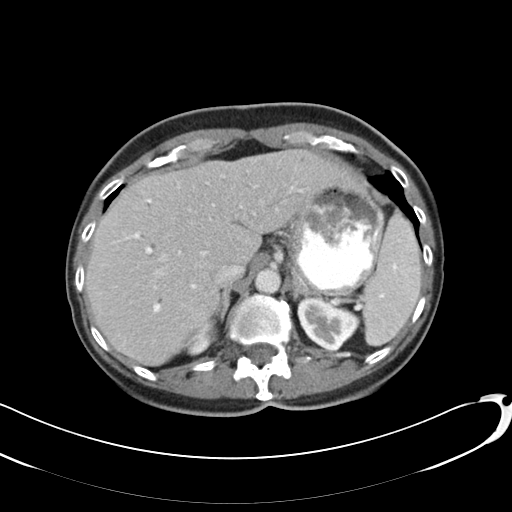
[im 78/83  soft-tissue]
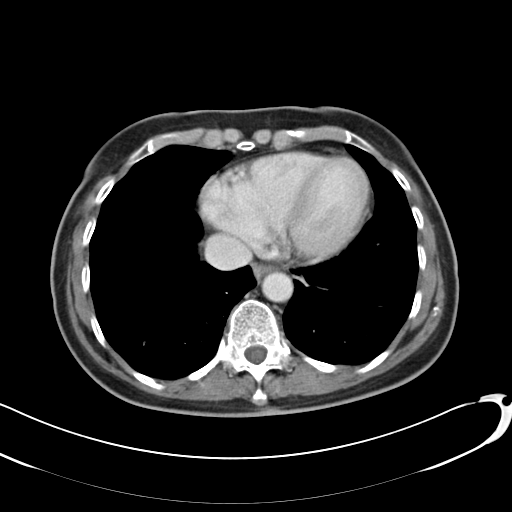

[Series 602: coronals · coronal · 0.83mm/px · 3 of 107 slices shown]
[im 36/107  soft-tissue]
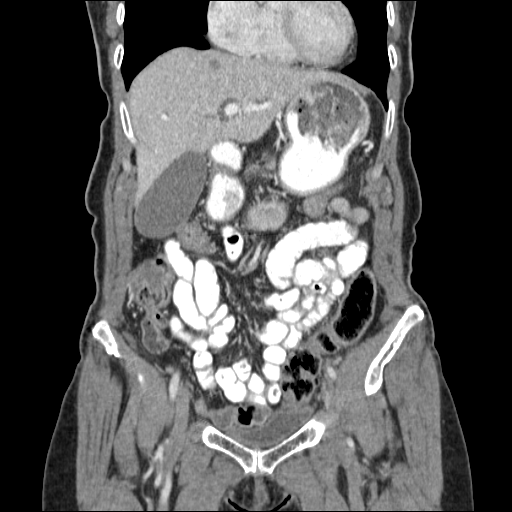
[im 48/107  soft-tissue]
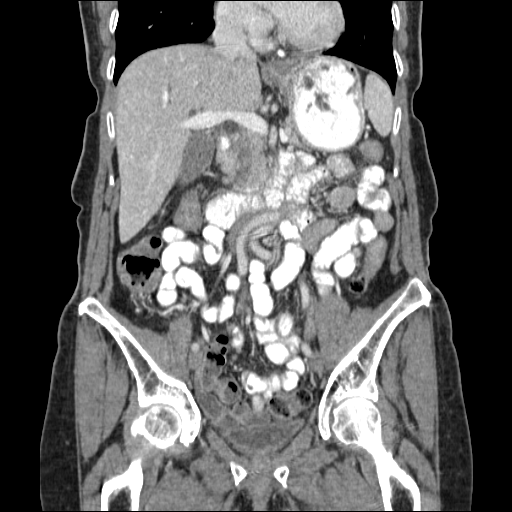
[im 59/107  soft-tissue]
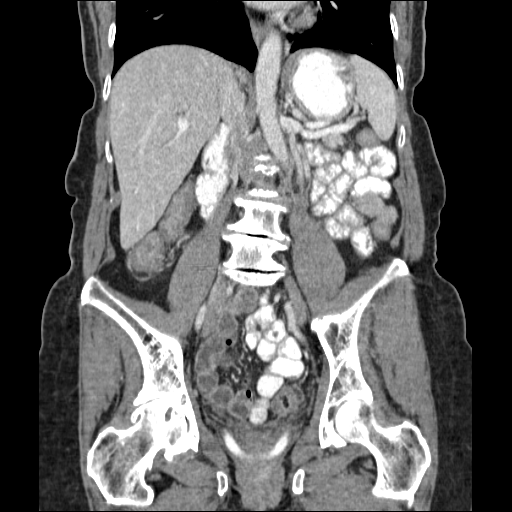

[15 of 46 positions shown; findings below may reference images not displayed]

FINDINGS: Lung Bases: Focal area of architectural distortion in the inferior
aspect of the right middle lobe is favored to represent an area of
infectious or inflammatory scarring. There is some associated
peripheral bronchiolectasis in this region.

Abdomen/Pelvis: In the anterior aspect of the head of the pancreas
there is a hypoenhancing mass measuring 2.9 x 1.7 x 4.3 cm. This
mass is immediately adjacent to the right lateral margin of the
superior mesenteric vein, but does not appear to encapsulate the
vein at this time. This mass also comes in close proximity to the
undersurface of the proximal portal vein, without definite
encasement of the vessel at this time. This mass is separate from
the superior mesenteric artery. This mass also appears separate from
the common hepatic artery, although the gastroduodenal artery
appears intimately associated with the anterolateral surface of the
mass on the right side best appreciated on image 25 of series 2.
Several prominent peripancreatic lymph nodes are noted adjacent to
the head of the pancreas, measuring up to 9 mm in short axis,
suspicious for a local nodal spread of disease. This mass is
compressing the distal aspect of the common bile duct. Proximal to
this, the common bile duct is markedly dilated measuring up to 13 mm
in the porta hepatis, and there is moderate intrahepatic biliary
ductal dilatation. Gallbladder also appears moderately distended.
The body and tail of the pancreas appear atrophic. No pancreatic
ductal dilatation is noted.

Several hepatic lesions are noted, the majority of which are too
small to characterize. The largest of these hepatic lesions is
clearly new compared to prior chest CT 09/23/2012, located in
segment 4A of the liver adjacent to the dome measuring 1.4 x 1.3 cm
(image 10 of series 2). Other smaller lesions are noted in segment 2
(image 15 of series [DATE] (image 25 of series 2), and 7 (image 19 of
series 2). The appearance of the spleen, bilateral adrenal glands
and left kidney is unremarkable. Sub cm low-attenuation lesion in
the lower pole of the right kidney is too small to characterize, but
statistically likely a tiny cyst.

Scattered colonic diverticulae are noted, without surrounding
inflammatory changes to suggest an acute diverticulitis at this
time. Normal appendix. No significant volume of ascites. No
pneumoperitoneum. No pathologic distention of small bowel. Status
post hysterectomy. Ovaries are not confidently identified may be
surgically absent or atrophic. Pessary in the vagina. Urinary
bladder is unremarkable in appearance.

Musculoskeletal: There are no aggressive appearing lytic or blastic
lesions noted in the visualized portions of the skeleton.
IMPRESSION: 1. 2.9 x 1.7 x 4.3 cm mass in the head of the pancreas, highly
concerning for primary pancreatic neoplasm. This is compressing the
distal common bile duct resulting in intra and extrahepatic biliary
ductal dilatation. No pancreatic ductal dilatation is noted at this
time. Multiple hepatic lesions are noted (discussed in detail
above), the largest of which is clearly new compared to prior CT
scan 09/23/2012, concerning for hepatic metastases. This could be
confirmed with MRI of the liver with and without IV gadolinium if
clinically appropriate.
2. Prominent peripancreatic lymph nodes are borderline enlarged, but
suspicious for local nodal disease. See above for full description
of this mass relative to adjacent vascular structures.
3. New area of architectural distortion and peripheral
bronchiolectasis in the inferior aspect of the right middle lobe.
Given the findings on the prior chest CT examinations, this is
favored to be related to a chronic indolent atypical infectious
process such as mycobacterium avium intracellulare (ING MING).
4. Additional findings, as above.
These results were discussed by telephone at the time of
interpretation on 03/22/2014 at [DATE] to Dr. GLIDES OSPITALECHE , who
verbally acknowledged these results.

## 2015-09-10 IMAGING — CR DG CHEST 2V
2 series · 2 of 2 positions shown · non-contrast
Comparison: 12/29/2013

CLINICAL DATA: Pancreas cancer.  Fever

EXAM:
CHEST  2 VIEW

[w chest pa]
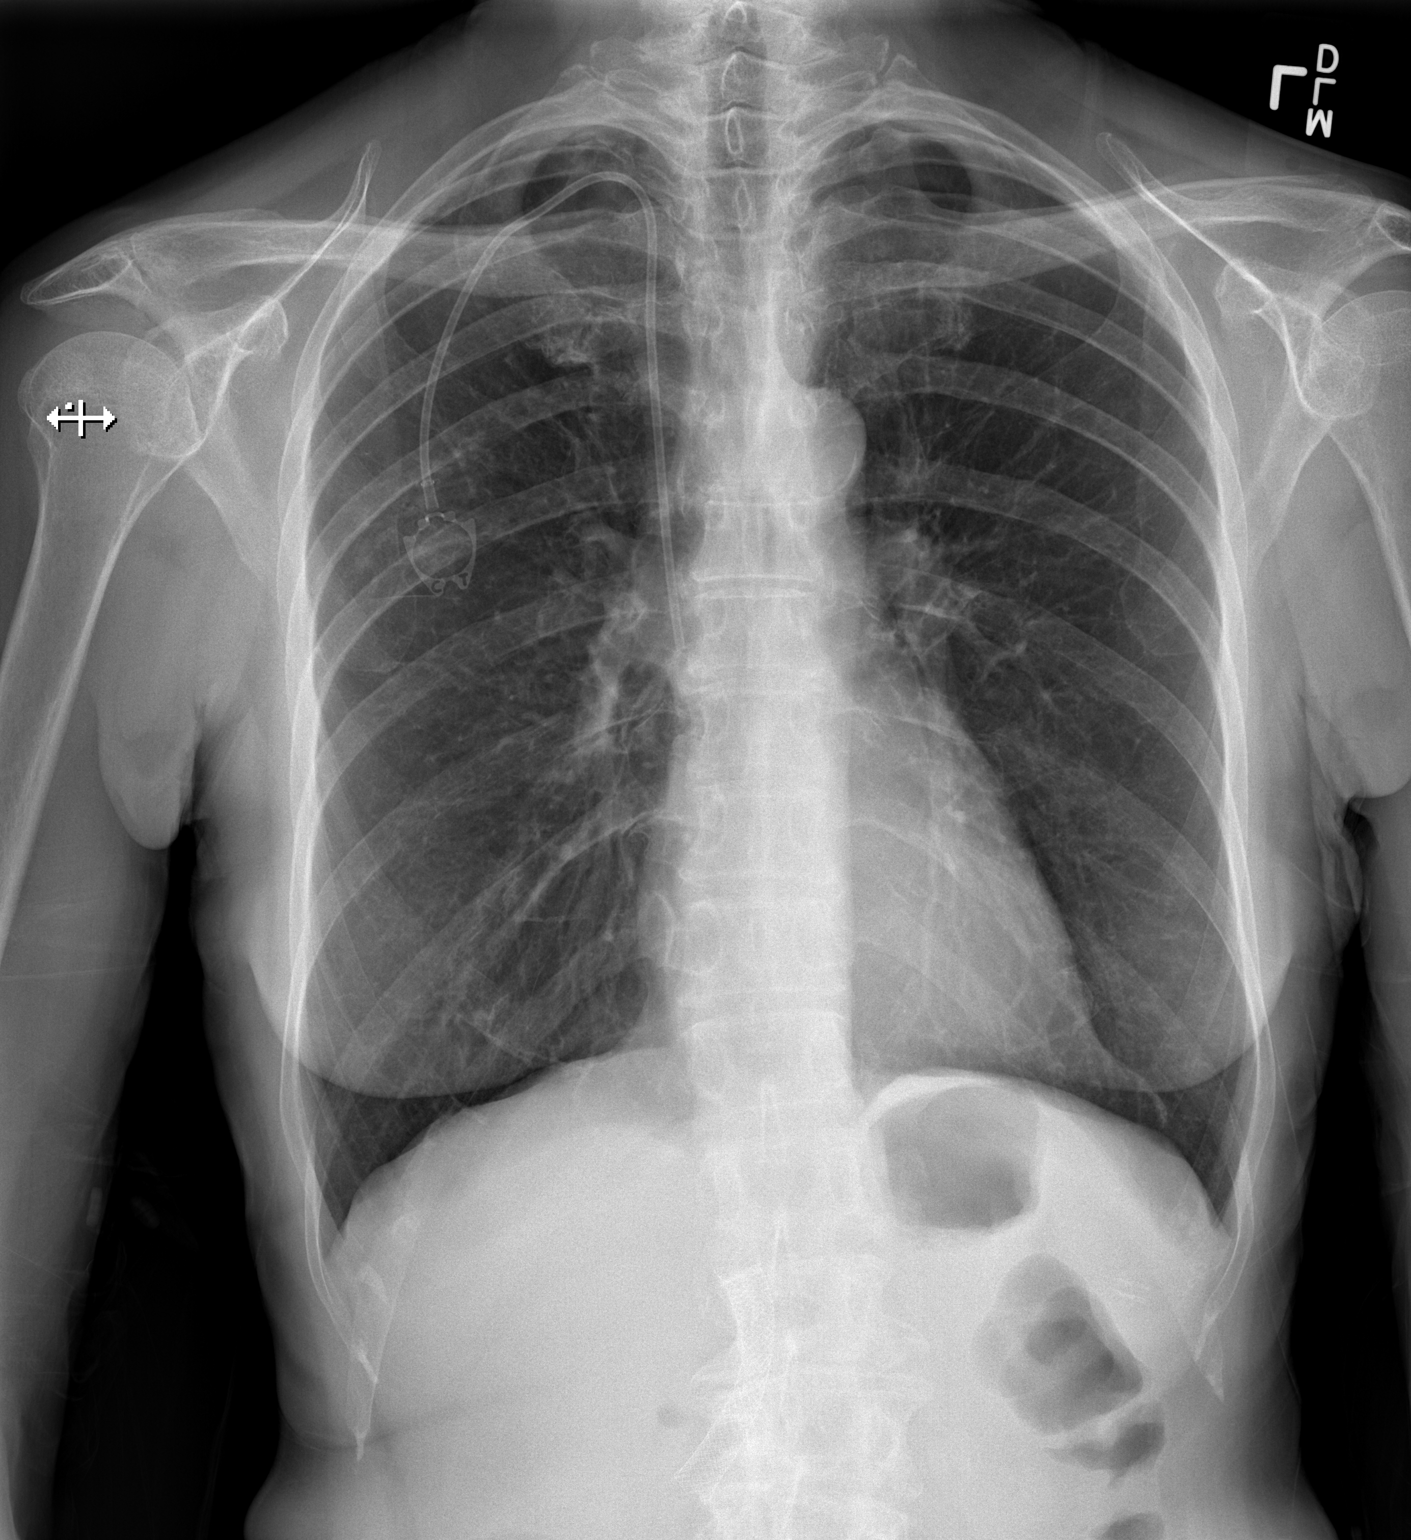

[w chest lat]
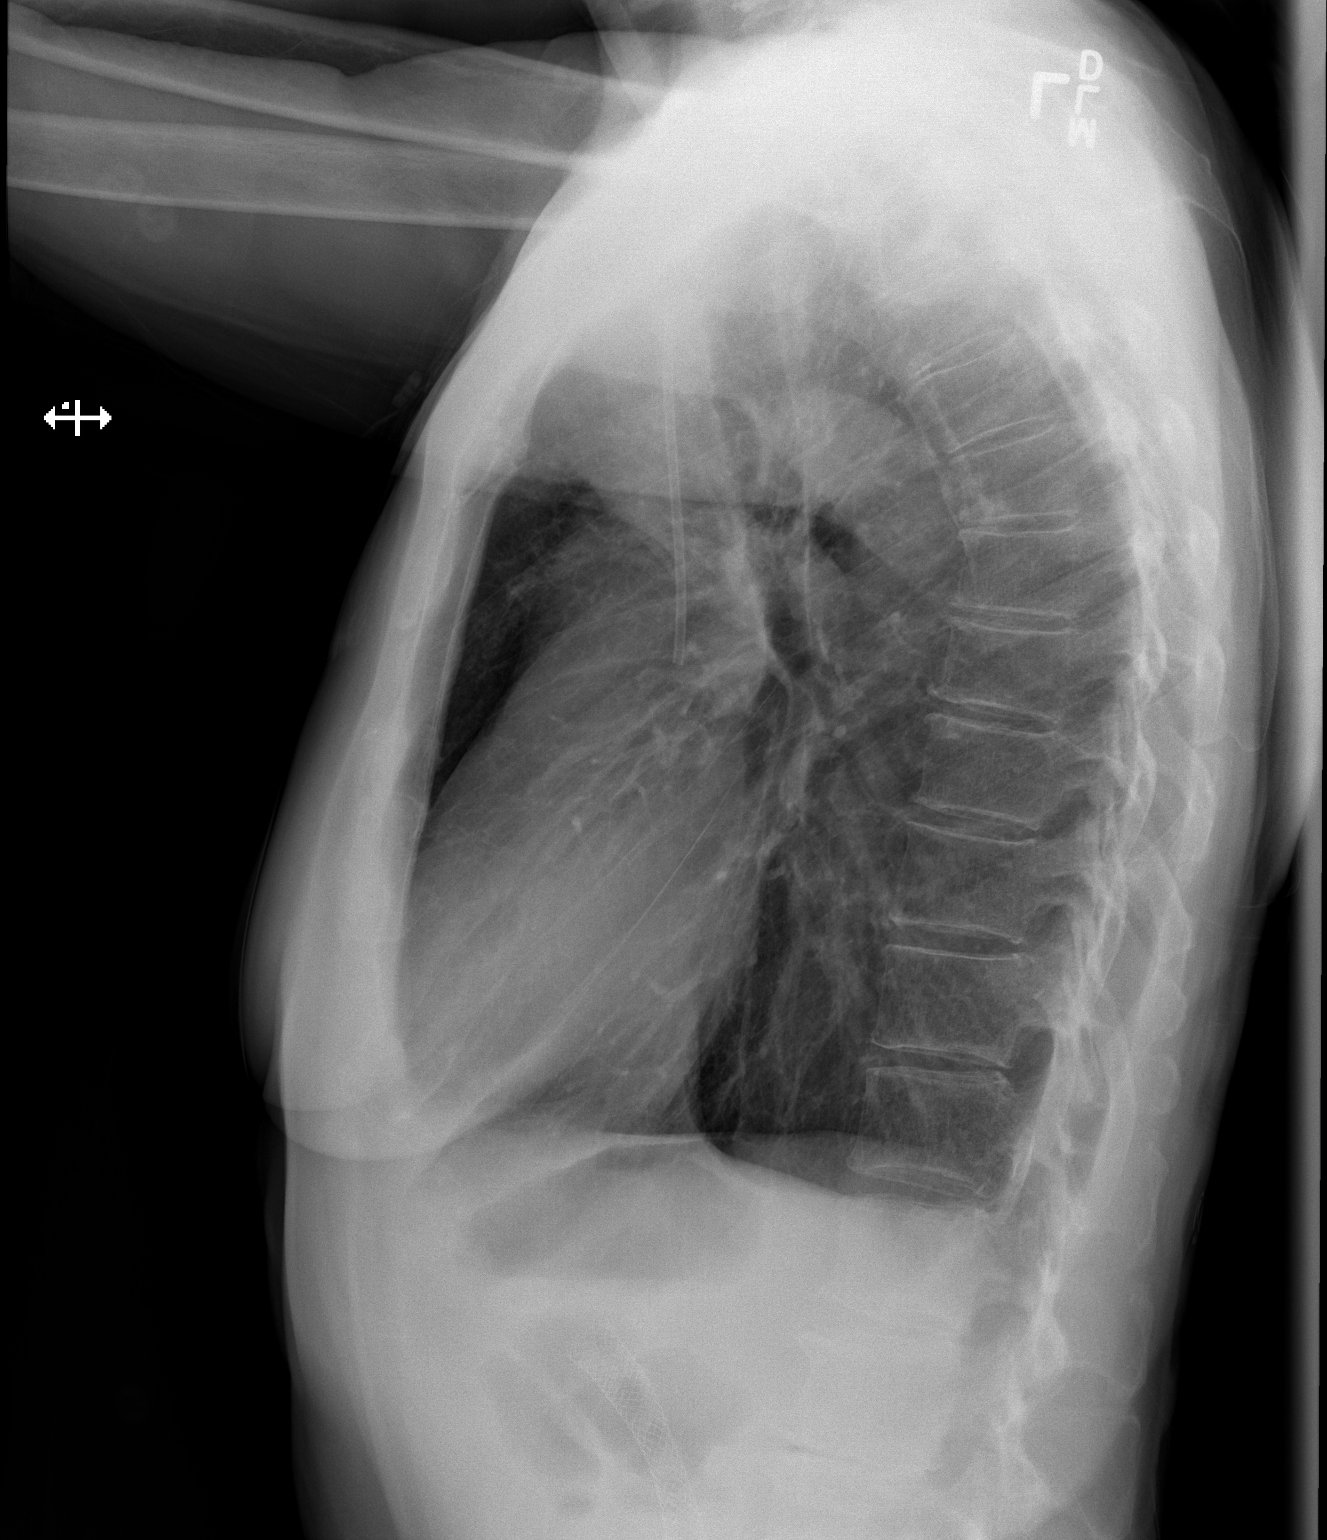

[2 of 2 positions shown; findings below may reference images not displayed]

FINDINGS: There is a right chest wall port a catheter with tip in the SVC. The
heart size is normal. No pleural effusion. There is no airspace
consolidation. There is a scoliosis deformity involving the lumbar
spine.
IMPRESSION: No active cardiopulmonary disease.
